# Patient Record
Sex: Male | Born: 1975 | Race: White | Hispanic: No | Marital: Married | Smoking: Current every day smoker
Health system: Southern US, Community
[De-identification: ages and names within clinical notes are randomized; demographics above are authoritative.]

## PROBLEM LIST (undated history)

## (undated) DIAGNOSIS — I10 Essential (primary) hypertension: Principal | ICD-10-CM

## (undated) DIAGNOSIS — F17210 Nicotine dependence, cigarettes, uncomplicated: Secondary | ICD-10-CM

## (undated) DIAGNOSIS — F988 Other specified behavioral and emotional disorders with onset usually occurring in childhood and adolescence: Secondary | ICD-10-CM

## (undated) HISTORY — PX: KNEE SURGERY: SHX244

## (undated) HISTORY — DX: Nicotine dependence, cigarettes, uncomplicated: F17.210

## (undated) HISTORY — PX: ANKLE SURGERY: SHX546

## (undated) HISTORY — PX: NECK SURGERY: SHX720

## (undated) HISTORY — DX: Other specified behavioral and emotional disorders with onset usually occurring in childhood and adolescence: F98.8

## (undated) HISTORY — DX: Essential (primary) hypertension: I10

## (undated) HISTORY — PX: BACK SURGERY: SHX140

## (undated) HISTORY — PX: EYE SURGERY: SHX253

---

## 2015-08-23 DIAGNOSIS — E291 Testicular hypofunction: Secondary | ICD-10-CM | POA: Insufficient documentation

## 2015-08-23 DIAGNOSIS — F339 Major depressive disorder, recurrent, unspecified: Secondary | ICD-10-CM | POA: Insufficient documentation

## 2015-08-23 DIAGNOSIS — E782 Mixed hyperlipidemia: Secondary | ICD-10-CM | POA: Insufficient documentation

## 2016-02-16 DIAGNOSIS — F411 Generalized anxiety disorder: Secondary | ICD-10-CM | POA: Insufficient documentation

## 2016-07-28 DIAGNOSIS — G4733 Obstructive sleep apnea (adult) (pediatric): Secondary | ICD-10-CM | POA: Insufficient documentation

## 2016-08-11 ENCOUNTER — Institutional Professional Consult (permissible substitution): Payer: Self-pay | Admitting: Pulmonary Disease

## 2017-03-15 ENCOUNTER — Ambulatory Visit: Payer: Worker's Compensation | Admitting: Physician Assistant

## 2017-04-19 DIAGNOSIS — A630 Anogenital (venereal) warts: Secondary | ICD-10-CM | POA: Insufficient documentation

## 2017-04-19 LAB — HEMOGLOBIN A1C: Hemoglobin A1C: 5.6

## 2017-09-24 DIAGNOSIS — J0140 Acute pansinusitis, unspecified: Secondary | ICD-10-CM | POA: Diagnosis not present

## 2017-09-24 DIAGNOSIS — J209 Acute bronchitis, unspecified: Secondary | ICD-10-CM | POA: Diagnosis not present

## 2017-10-12 DIAGNOSIS — E782 Mixed hyperlipidemia: Secondary | ICD-10-CM | POA: Diagnosis not present

## 2017-10-12 DIAGNOSIS — R7301 Impaired fasting glucose: Secondary | ICD-10-CM | POA: Diagnosis not present

## 2017-10-12 DIAGNOSIS — F411 Generalized anxiety disorder: Secondary | ICD-10-CM | POA: Diagnosis not present

## 2017-10-12 DIAGNOSIS — F3342 Major depressive disorder, recurrent, in full remission: Secondary | ICD-10-CM | POA: Diagnosis not present

## 2017-10-12 LAB — BASIC METABOLIC PANEL
BUN: 16 (ref 4–21)
Creatinine: 1.3 (ref 0.6–1.3)
Glucose: 109
POTASSIUM: 5 (ref 3.4–5.3)
Sodium: 140 (ref 137–147)

## 2017-10-12 LAB — LIPID PANEL
Cholesterol: 205 — AB (ref 0–200)
HDL: 34 — AB (ref 35–70)
LDL Cholesterol: 127
Triglycerides: 194 — AB (ref 40–160)

## 2017-12-10 DIAGNOSIS — G629 Polyneuropathy, unspecified: Secondary | ICD-10-CM | POA: Diagnosis not present

## 2017-12-10 DIAGNOSIS — F411 Generalized anxiety disorder: Secondary | ICD-10-CM | POA: Diagnosis not present

## 2017-12-10 DIAGNOSIS — F329 Major depressive disorder, single episode, unspecified: Secondary | ICD-10-CM | POA: Diagnosis not present

## 2018-02-12 DIAGNOSIS — M25521 Pain in right elbow: Secondary | ICD-10-CM | POA: Diagnosis not present

## 2018-02-12 DIAGNOSIS — F411 Generalized anxiety disorder: Secondary | ICD-10-CM | POA: Diagnosis not present

## 2018-02-12 DIAGNOSIS — F3342 Major depressive disorder, recurrent, in full remission: Secondary | ICD-10-CM | POA: Diagnosis not present

## 2018-06-20 ENCOUNTER — Ambulatory Visit (HOSPITAL_COMMUNITY)
Admission: EM | Admit: 2018-06-20 | Discharge: 2018-06-20 | Disposition: A | Discharge status: Home / Self Care | Payer: BLUE CROSS/BLUE SHIELD | Attending: Family Medicine | Admitting: Family Medicine

## 2018-06-20 ENCOUNTER — Encounter (HOSPITAL_COMMUNITY): Payer: Self-pay

## 2018-06-20 DIAGNOSIS — I10 Essential (primary) hypertension: Secondary | ICD-10-CM | POA: Diagnosis not present

## 2018-06-20 DIAGNOSIS — E785 Hyperlipidemia, unspecified: Secondary | ICD-10-CM | POA: Insufficient documentation

## 2018-06-20 DIAGNOSIS — M79642 Pain in left hand: Secondary | ICD-10-CM | POA: Insufficient documentation

## 2018-06-20 DIAGNOSIS — M79641 Pain in right hand: Secondary | ICD-10-CM | POA: Insufficient documentation

## 2018-06-20 MED ORDER — AMLODIPINE BESYLATE 5 MG PO TABS: 5.0000 mg | ORAL_TABLET | Freq: Every day | ORAL | 1 refills | Status: DC

## 2018-06-20 NOTE — Discharge Instructions (Addendum)
Amlodipine is a medicine that should help both the blood pressure and the cold hands It may need to be adjusted to a higher dose.   I believe the headaches will improve with BP treatment You need to see a PCP for better heart disease prevention- try the [facility] at [place] or [facility] I would go back on the wellbutrin.  It helps people quit smoking Once the BP comes down, you can re try the meloxicam Return if worse instead of better

## 2018-06-20 NOTE — ED Provider Notes (Signed)
[facility]    CSN: [id number] Arrival date & time: 06/20/18  1734     History   Chief Complaint Chief Complaint  Patient presents with  . Hypertension    HPI Victor Meza is a 43 y.o. male.   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  After he laid down for a few minutes the blood pressure did come back to normal and he was able to get a DOT card for 2 years.  This was several months ago.  Lately he has been having headaches.  His wife checked his blood pressure and found it to be elevated.  It is consistently staying in the 150-170/90 to 100 range over weeks.  He has intermittent headaches.  Intermittent pain in his left fingers.  He does acknowledge the left finger pain is a different issue as it is been doing this chronically, and he describes a white discoloration of his fingers as they get cold.  He also has rotator cuff problems with pain in his left shoulder.  He also has pain in his right elbow.  Because they read side effects and were concerned, he stopped taking his meloxicam although as this was helping his elbow.  He also went off of his Wellbutrin because he thought this might be causing his blood pressure to be elevated.  There is blood pressure in his family. Other issues include, untreated OSA.  States he does not tolerate CPAP He also has a dyslipidemia with an HDL of 30.  He is not on any cholesterol medication He is a smoker He has a family history of cardiovascular disease We discussed that he has multiple cardiovascular risk factors.  I did Framingham risk study to see what his ten-year risk of developing heart disease is at it is over 19%.  I told him it is important that he see his PCP for better cardiac risk factor management and prevention of CVD  History reviewed. No pertinent past medical history.  There are no active problems to display for this patient.   Past Surgical History:  Procedure Laterality  Date  . ANKLE SURGERY    . BACK SURGERY    . EYE SURGERY    . KNEE SURGERY         Home Medications    Prior to Admission medications   Medication Sig Start Date End Date Taking? Authorizing Provider  DULoxetine (CYMBALTA) 60 MG capsule Take 60 mg by mouth daily.   Yes [provider]  amLODipine (NORVASC) 5 MG tablet Take 1 tablet (5 mg total) by mouth daily. 06/20/18   Eustace MooreNelson, Jolynn Bajorek Sue, MD    Family History Family History  Problem Relation Age of Onset  . Diabetes Mother   . Stroke Father   . Thyroid disease Father   . Cancer Father   . Diabetes Other   . Hypertension Other     Social History Social History   Tobacco Use  . Smoking status: Heavy Tobacco Smoker    Packs/day: 1.00    Types: Cigarettes  . Smokeless tobacco: Never Used  Substance Use Topics  . Alcohol use: Not on file  . Drug use: Not on file     Allergies   Other; Percocet [oxycodone-acetaminophen]; and Shellfish allergy   Review of Systems Review of Systems  Constitutional: Negative for chills and fever.  HENT: Negative for ear pain and sore throat.   Eyes: Negative for pain and  visual disturbance.  Respiratory: Negative for cough and shortness of breath.   Cardiovascular: Positive for chest pain. Negative for palpitations.  Gastrointestinal: Negative for abdominal pain and vomiting.  Genitourinary: Negative for dysuria and hematuria.  Musculoskeletal: Positive for arthralgias. Negative for back pain.  Skin: Positive for color change. Negative for rash.  Neurological: Positive for numbness and headaches. Negative for seizures and syncope.  All other systems reviewed and are negative.    Physical Exam Triage Vital Signs ED Triage Vitals  Enc Vitals Group     BP 06/20/18 1818 (!) 148/97     Pulse Rate 06/20/18 1818 97     Resp 06/20/18 1818 20     Temp 06/20/18 1818 98.4 F (36.9 C)     Temp Source 06/20/18 1818 Oral     SpO2 06/20/18 1818 99 %     Weight --       Height --      Head Circumference --      Peak Flow --      Pain Score 06/20/18 1823 6     Pain Loc --      Pain Edu? --      Excl. in GC? --    No data found.  Updated Vital Signs BP (!) 148/97 (BP Location: Right Arm)   Pulse 97   Temp 98.4 F (36.9 C) (Oral)   Resp 20   SpO2 99%       Physical Exam Constitutional:      General: He is not in acute distress.    Appearance: He is well-developed.  HENT:     Head: Normocephalic and atraumatic.     Right Ear: Tympanic membrane and ear canal normal.     Left Ear: Tympanic membrane and ear canal normal.     Nose: Nose normal.     Mouth/Throat:     Mouth: Mucous membranes are moist.  Eyes:     Conjunctiva/sclera: Conjunctivae normal.     Pupils: Pupils are equal, round, and reactive to light.  Neck:     Musculoskeletal: Normal range of motion and neck supple.     Vascular: No carotid bruit.  Cardiovascular:     Rate and Rhythm: Normal rate and regular rhythm.     Heart sounds: Normal heart sounds.  Pulmonary:     Effort: Pulmonary effort is normal. No respiratory distress.     Breath sounds: Normal breath sounds.  Abdominal:     General: There is no distension.     Palpations: Abdomen is soft. There is no mass.     Comments: No HSM  Musculoskeletal: Normal range of motion.  Skin:    General: Skin is warm and dry.  Neurological:     General: No focal deficit present.     Mental Status: He is alert. Mental status is at baseline.  Psychiatric:        Mood and Affect: Mood normal.        Thought Content: Thought content normal.      UC Treatments / Results  Labs (all labs ordered are listed, but only abnormal results are displayed) Labs Reviewed - No data to display  EKG None  Radiology No results found.  Procedures Procedures (including critical care time)  Medications Ordered in UC Medications - No data to display  Initial Impression / Assessment and Plan / UC Course  I have reviewed the triage  vital signs and the nursing notes.  Pertinent labs & imaging results  that were available during my care of the patient were reviewed by me and considered in my medical decision making (see chart for details).     Because the patient has hypertension of  multiple readings and high cardiac risk factors I would not start her on blood pressure medication.  You must follow-up with the PCP for ongoing management.  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 Strongly advised him to quit smoking Final Clinical Impressions(s) / UC Diagnoses   Final diagnoses:  Hypertension, unspecified type  Dyslipidemia (high LDL; low HDL)  Pain in both hands     Discharge Instructions     Amlodipine is a medicine that should help both the blood pressure and the cold hands It may need to be adjusted to a higher dose.   I believe the headaches will improve with BP treatment You need to see a PCP for better heart disease prevention- try the [facility] at [place] or [facility] I would go back on the wellbutrin.  It helps people quit smoking Once the BP comes down, you can re try the meloxicam Return if worse instead of better     ED Prescriptions    Medication Sig Dispense Auth. Provider   amLODipine (NORVASC) 5 MG tablet Take 1 tablet (5 mg total) by mouth daily. 30 tablet Eustace Moore, MD     Controlled Substance Prescriptions [place] Controlled Substance Registry consulted? Not Applicable   Eustace Moore, MD 06/20/18 2209

## 2018-06-20 NOTE — ED Triage Notes (Signed)
Pt presents with elevated blood pressure for the past few weeks, headache, mild chest ache,  and hand numbness.

## 2018-07-03 ENCOUNTER — Encounter: Payer: Self-pay | Admitting: *Deleted

## 2018-07-03 ENCOUNTER — Other Ambulatory Visit: Payer: Self-pay | Admitting: *Deleted

## 2018-07-16 ENCOUNTER — Ambulatory Visit: Payer: BLUE CROSS/BLUE SHIELD | Admitting: Family Medicine

## 2018-07-19 ENCOUNTER — Encounter: Payer: Self-pay | Admitting: Family Medicine

## 2018-07-19 ENCOUNTER — Ambulatory Visit (INDEPENDENT_AMBULATORY_CARE_PROVIDER_SITE_OTHER): Payer: BLUE CROSS/BLUE SHIELD | Admitting: Family Medicine

## 2018-07-19 ENCOUNTER — Other Ambulatory Visit: Payer: Self-pay

## 2018-07-19 VITALS — BP 146/82 | HR 80 | Temp 98.9°F | Resp 16 | Ht 72.0 in | Wt 216.6 lb

## 2018-07-19 DIAGNOSIS — G4733 Obstructive sleep apnea (adult) (pediatric): Secondary | ICD-10-CM

## 2018-07-19 DIAGNOSIS — E782 Mixed hyperlipidemia: Secondary | ICD-10-CM | POA: Diagnosis not present

## 2018-07-19 DIAGNOSIS — I10 Essential (primary) hypertension: Secondary | ICD-10-CM | POA: Diagnosis not present

## 2018-07-19 DIAGNOSIS — F17210 Nicotine dependence, cigarettes, uncomplicated: Secondary | ICD-10-CM

## 2018-07-19 DIAGNOSIS — I7300 Raynaud's syndrome without gangrene: Secondary | ICD-10-CM

## 2018-07-19 DIAGNOSIS — F988 Other specified behavioral and emotional disorders with onset usually occurring in childhood and adolescence: Secondary | ICD-10-CM

## 2018-07-19 DIAGNOSIS — F339 Major depressive disorder, recurrent, unspecified: Secondary | ICD-10-CM

## 2018-07-19 DIAGNOSIS — Z8249 Family history of ischemic heart disease and other diseases of the circulatory system: Secondary | ICD-10-CM | POA: Insufficient documentation

## 2018-07-19 HISTORY — DX: Nicotine dependence, cigarettes, uncomplicated: F17.210

## 2018-07-19 HISTORY — DX: Essential (primary) hypertension: I10

## 2018-07-19 HISTORY — DX: Other specified behavioral and emotional disorders with onset usually occurring in childhood and adolescence: F98.8

## 2018-07-19 LAB — LIPID PANEL
Cholesterol: 255 mg/dL — ABNORMAL HIGH (ref 0–200)
HDL: 39.9 mg/dL (ref >39.00)
LDL Cholesterol: 188 mg/dL — ABNORMAL HIGH (ref 0–99)
NonHDL: 214.95
Total CHOL/HDL Ratio: 6
Triglycerides: 134 mg/dL (ref 0.0–149.0)
VLDL: 26.8 mg/dL (ref 0.0–40.0)

## 2018-07-19 LAB — CBC WITH DIFFERENTIAL/PLATELET
Basophils Absolute: 0.1 10*3/uL (ref 0.0–0.1)
Basophils Relative: 1.3 % (ref 0.0–3.0)
EOS ABS: 0.3 10*3/uL (ref 0.0–0.7)
Eosinophils Relative: 4.1 % (ref 0.0–5.0)
HEMATOCRIT: 41.6 % (ref 39.0–52.0)
Hemoglobin: 14.4 g/dL (ref 13.0–17.0)
Lymphocytes Relative: 24.3 % (ref 12.0–46.0)
Lymphs Abs: 1.5 10*3/uL (ref 0.7–4.0)
MCHC: 34.7 g/dL (ref 30.0–36.0)
MCV: 89 fl (ref 78.0–100.0)
Monocytes Absolute: 0.4 10*3/uL (ref 0.1–1.0)
Monocytes Relative: 7 % (ref 3.0–12.0)
NEUTROS ABS: 4 10*3/uL (ref 1.4–7.7)
Neutrophils Relative %: 63.3 % (ref 43.0–77.0)
PLATELETS: 286 10*3/uL (ref 150.0–400.0)
RBC: 4.67 Mil/uL (ref 4.22–5.81)
RDW: 12.6 % (ref 11.5–15.5)
WBC: 6.3 10*3/uL (ref 4.0–10.5)

## 2018-07-19 LAB — COMPREHENSIVE METABOLIC PANEL
ALT: 24 U/L (ref 0–53)
AST: 17 U/L (ref 0–37)
Albumin: 4.3 g/dL (ref 3.5–5.2)
Alkaline Phosphatase: 50 U/L (ref 39–117)
BUN: 11 mg/dL (ref 6–23)
CO2: 28 mEq/L (ref 19–32)
Calcium: 9.3 mg/dL (ref 8.4–10.5)
Chloride: 103 mEq/L (ref 96–112)
Creatinine, Ser: 1.16 mg/dL (ref 0.40–1.50)
GFR: 68.39 mL/min (ref >60.00)
Glucose, Bld: 89 mg/dL (ref 70–99)
Potassium: 4.2 mEq/L (ref 3.5–5.1)
Sodium: 137 mEq/L (ref 135–145)
Total Bilirubin: 0.4 mg/dL (ref 0.2–1.2)
Total Protein: 7 g/dL (ref 6.0–8.3)

## 2018-07-19 LAB — TSH: TSH: 2.71 u[IU]/mL (ref 0.35–4.50)

## 2018-07-19 MED ORDER — AMLODIPINE BESYLATE 10 MG PO TABS: 10.0000 mg | ORAL_TABLET | Freq: Every day | ORAL | 3 refills | Status: DC

## 2018-07-19 NOTE — Patient Instructions (Addendum)
Please return in 6 weeks for follow up of your hypertension. I've increased the dose of the amlodipine today. This should help bring down your blood pressure readings.   Start thinking about what would be the reason/sign you would know it was time to quit smoking.... this is your number one health risk factor.   We will call you with information regarding your referral appointment. Pulmonology for sleep apnea follow up.  If you do not hear from [place] within the next 2 weeks, please let me know. It can take 1-2 weeks to get appointments set up with the specialists.    It was a pleasure meeting you today! Thank you for choosing [place] to meet your healthcare needs! I truly look forward to working with you. If you have any questions or concerns, please send me a message via Mychart or call the office at [phone].  Your Heart disease and stroke risk factors include: high blood pressure, high cholesterol, smoking, sleep apnea and family history or early heart disease.    Hypertension Hypertension, commonly called high blood pressure, is when the force of blood pumping through the arteries is too strong. The arteries are the blood vessels that carry blood from the heart throughout the body. Hypertension forces the heart to work harder to pump blood and may cause arteries to become narrow or stiff. Having untreated or uncontrolled hypertension can cause heart attacks, strokes, kidney disease, and other problems. A blood pressure reading consists of a higher number over a lower number. Ideally, your blood pressure should be below 120/80. The first ("top") number is called the systolic pressure. It is a measure of the pressure in your arteries as your heart beats. The second ("bottom") number is called the diastolic pressure. It is a measure of the pressure in your arteries as the heart relaxes. What are the causes? The cause of this condition is not known. What increases the risk? Some risk factors for high  blood pressure are under your control. Others are not. Factors you can change  Smoking.  Having type 2 diabetes mellitus, high cholesterol, or both.  Not getting enough exercise or physical activity.  Being overweight.  Having too much fat, sugar, calories, or salt (sodium) in your diet.  Drinking too much alcohol. Factors that are difficult or impossible to change  Having chronic kidney disease.  Having a family history of high blood pressure.  Age. Risk increases with age.  Race. You may be at higher risk if you are African-American.  Gender. Men are at higher risk than women before age 60. After age 4, women are at higher risk than men.  Having obstructive sleep apnea.  Stress. What are the signs or symptoms? Extremely high blood pressure (hypertensive crisis) may cause:  Headache.  Anxiety.  Shortness of breath.  Nosebleed.  Nausea and vomiting.  Severe chest pain.  Jerky movements you cannot control (seizures). How is this diagnosed? This condition is diagnosed by measuring your blood pressure while you are seated, with your arm resting on a surface. The cuff of the blood pressure monitor will be placed directly against the skin of your upper arm at the level of your heart. It should be measured at least twice using the same arm. Certain conditions can cause a difference in blood pressure between your right and left arms. Certain factors can cause blood pressure readings to be lower or higher than normal (elevated) for a short period of time:  When your blood pressure is higher  when you are in a health care provider's office than when you are at home, this is called white coat hypertension. Most people with this condition do not need medicines.  When your blood pressure is higher at home than when you are in a health care provider's office, this is called masked hypertension. Most people with this condition may need medicines to control blood pressure. If you  have a high blood pressure reading during one visit or you have normal blood pressure with other risk factors:  You may be asked to return on a different day to have your blood pressure checked again.  You may be asked to monitor your blood pressure at home for 1 week or longer. If you are diagnosed with hypertension, you may have other blood or imaging tests to help your health care provider understand your overall risk for other conditions. How is this treated? This condition is treated by making healthy lifestyle changes, such as eating healthy foods, exercising more, and reducing your alcohol intake. Your health care provider may prescribe medicine if lifestyle changes are not enough to get your blood pressure under control, and if:  Your systolic blood pressure is above 130.  Your diastolic blood pressure is above 80. Your personal target blood pressure may vary depending on your medical conditions, your age, and other factors. Follow these instructions at home: Eating and drinking   Eat a diet that is high in fiber and potassium, and low in sodium, added sugar, and fat. An example eating plan is called the DASH (Dietary Approaches to Stop Hypertension) diet. To eat this way: ? Eat plenty of fresh fruits and vegetables. Try to fill half of your plate at each meal with fruits and vegetables. ? Eat whole grains, such as whole wheat pasta, brown rice, or whole grain bread. Fill about one quarter of your plate with whole grains. ? Eat or drink low-fat dairy products, such as skim milk or low-fat yogurt. ? Avoid fatty cuts of meat, processed or cured meats, and poultry with skin. Fill about one quarter of your plate with lean proteins, such as fish, chicken without skin, beans, eggs, and tofu. ? Avoid premade and processed foods. These tend to be higher in sodium, added sugar, and fat.  Reduce your daily sodium intake. Most people with hypertension should eat less than 1,500 mg of sodium a  day.  Limit alcohol intake to no more than 1 drink a day for nonpregnant women and 2 drinks a day for men. One drink equals 12 oz of beer, 5 oz of wine, or 1 oz of hard liquor. Lifestyle   Work with your health care provider to maintain a healthy body weight or to lose weight. Ask what an ideal weight is for you.  Get at least 30 minutes of exercise that causes your heart to beat faster (aerobic exercise) most days of the week. Activities may include walking, swimming, or biking.  Include exercise to strengthen your muscles (resistance exercise), such as pilates or lifting weights, as part of your weekly exercise routine. Try to do these types of exercises for 30 minutes at least 3 days a week.  Do not use any products that contain nicotine or tobacco, such as cigarettes and e-cigarettes. If you need help quitting, ask your health care provider.  Monitor your blood pressure at home as told by your health care provider.  Keep all follow-up visits as told by your health care provider. This is important. Medicines  Take  over-the-counter and prescription medicines only as told by your health care provider. Follow directions carefully. Blood pressure medicines must be taken as prescribed.  Do not skip doses of blood pressure medicine. Doing this puts you at risk for problems and can make the medicine less effective.  Ask your health care provider about side effects or reactions to medicines that you should watch for. Contact a health care provider if:  You think you are having a reaction to a medicine you are taking.  You have headaches that keep coming back (recurring).  You feel dizzy.  You have swelling in your ankles.  You have trouble with your vision. Get help right away if:  You develop a severe headache or confusion.  You have unusual weakness or numbness.  You feel faint.  You have severe pain in your chest or abdomen.  You vomit repeatedly.  You have trouble  breathing. Summary  Hypertension is when the force of blood pumping through your arteries is too strong. If this condition is not controlled, it may put you at risk for serious complications.  Your personal target blood pressure may vary depending on your medical conditions, your age, and other factors. For most people, a normal blood pressure is less than 120/80.  Hypertension is treated with lifestyle changes, medicines, or a combination of both. Lifestyle changes include weight loss, eating a healthy, low-sodium diet, exercising more, and limiting alcohol. This information is not intended to replace advice given to you by your health care provider. Make sure you discuss any questions you have with your health care provider. Document Released: 06/05/2005 Document Revised: 05/03/2016 Document Reviewed: 05/03/2016 Elsevier Interactive Patient Education  2019 [employer].

## 2018-07-19 NOTE — Progress Notes (Signed)
Subjective  CC:  Chief Complaint  Patient presents with  . Establish Care    Last CPE 06/2017  . Hypertension    Taking amlodipine and wants to discuss other options  . Depression    Taking Bupropion daily    HPI: Victor Meza is a 43 y.o. male who presents to [place] at [facility] today to establish care with me as a new patient.  I've personally reviewed recent office visit notes, hospital notes, associated labs and imaging reports and/or pertinent outside office records via chart review or CareEverywhere. Briefly, recent UC visit for elevated blood pressure readings: new DX HTN started on amlodipine. Reviewed past medical records from PCP and pulm. Updated PL in detail as documented below.  He has the following concerns or needs:  HTN: on amlodipine 5 with decrease in diastolics. Tolerating well. Has multiple CV risk factors: HLD, smoking, + FH CAD, and sleep apnea no longer on cpap or bipap. He is motivated to get these things under better control. Compliance with amlodipine has been good. 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 Diet is standard [place].   Tobacco dependence: 30 pak year history. precontemplative about quitting. Has never successfully quit in the past although he has tried. Denies known COPD sxs or dx.   OSA: needs new pulmonologist. Reports "last doctor didn't listen". Records show he was on cpap and then bipap which he didn't tolerate.   Mood disorder: treated for depression with anxiety with cymbalta 60 and wellbutrin. Reports meds help. Endorses easy irritability. Mood is ok now. Also with h/o ADD as child; not treated with meds as an adult but manages behaviorally.   raynauds syndrome in fingers: at time it is mildly painful.   HM: nonfasting today. Last labs 4/ 2019, reviewed.    BP Readings from Last 3 Encounters:  07/19/18 (!) 146/82  06/20/18 (!) 148/97   Wt Readings from Last 3 Encounters:  07/19/18 216 lb  9.6 oz (98.2 kg)    Lab Results  Component Value Date   CHOL 205 (A) 10/12/2017   Lab Results  Component Value Date   HDL 34 (A) 10/12/2017   Lab Results  Component Value Date   LDLCALC 127 10/12/2017   Lab Results  Component Value Date   TRIG 194 (A) 10/12/2017   No results found for: CHOLHDL No results found for: LDLDIRECT Lab Results  Component Value Date   CREATININE 1.3 10/12/2017   BUN 16 10/12/2017   NA 140 10/12/2017   K 5.0 10/12/2017    The 10-year ASCVD risk score Mikey Bussing DC Jr., et al., 2013) is: 12.4%*   Values used to calculate the score:     Age: 44 years     Sex: Male     Is Non-Hispanic African American: No     Diabetic: No     Tobacco smoker: Yes     Systolic Blood Pressure: 845 mmHg     Is BP treated: Yes     HDL Cholesterol: 34 mg/dL*     Total Cholesterol: 205 mg/dL*     * - Cholesterol units were assumed for this score calculation  Assessment  1. Essential hypertension   2. Nicotine dependence, cigarettes, uncomplicated   3. Mixed dyslipidemia   4. OSA (obstructive sleep apnea)   5. Major depression, recurrent, chronic (HCC)   6. Raynaud's disease without gangrene   7. 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 Increase amlodipine to '10mg'$  daily and recheck in 6 weeks. Add 2nd medication at that time if needed. Check renal function and electrolytes.  HLD: recheck today. Likely a statin will be indicated. Discussed healthy diet.   Tobacco: precontemplative. Counseling done.   Raynauds: amlodipine  OSA - refer to pulm for re-evaluation.   Mood: stable by report.   Educated about CV risk factor reduction.   Follow up:  Return in about 6 weeks (around 08/30/2018) for follow up Hypertension. Orders Placed This Encounter  Procedures  . CBC w/Diff  . Comp Met (CMET)  . Lipid Profile  . TSH  . Ambulatory referral to Pulmonology    Meds ordered this encounter  Medications  . amLODipine (NORVASC) 10 MG tablet    Sig: Take 1 tablet (10 mg total) by mouth daily.    Dispense:  90 tablet    Refill:  3     Depression screen PHQ 2/9 07/19/2018  Decreased Interest 0  Down, Depressed, Hopeless 1  PHQ - 2 Score 1  Altered sleeping 0  Tired, decreased energy 0  Change in appetite 0  Feeling bad or failure about yourself  1  Trouble concentrating 0  Moving slowly or fidgety/restless 0  Suicidal thoughts 0  PHQ-9 Score 2  Difficult doing work/chores Not difficult at all    We updated and reviewed the patient's past history in detail and it is documented below.  Patient Active Problem List   Diagnosis Date Noted  . Essential hypertension 07/19/2018    Priority: High  . Nicotine dependence, cigarettes, uncomplicated 11/91/4782    Priority: High  . Family history of premature CAD 07/19/2018    Priority: High    Maternal GF in 8s Dad had heart attack 90s   . OSA (obstructive sleep apnea) 07/28/2016    Priority: High  . Generalized anxiety disorder 02/16/2016    Priority: High  . Major depression, recurrent, chronic ([place]) 08/23/2015    Priority: High  . Mixed dyslipidemia 08/23/2015    Priority: High  . Raynaud's disease without gangrene 07/19/2018    Priority: Medium  . ADD (attention deficit disorder) 07/19/2018    Priority: Medium    Diagnosed as child: never treated as an adult   . Genital warts 04/19/2017    Priority: Low  . Hypogonadism in male 08/23/2015    Priority: [place] Maintenance  Topic Date Due  . HIV Screening  07/08/1989  . INFLUENZA VACCINE  09/17/2018 (Originally 01/17/2018)  . TETANUS/TDAP  06/19/2021   Immunization History  Administered Date(s) Administered  . Tdap 06/20/2011   Current Meds  Medication Sig  . amLODipine (NORVASC) 10 MG tablet Take 1 tablet (10 mg total) by mouth daily.  Marland Kitchen buPROPion (WELLBUTRIN XL) 300 MG 24 hr tablet Take by mouth.  . DULoxetine  (CYMBALTA) 60 MG capsule Take 60 mg by mouth daily.  . meloxicam (MOBIC) 15 MG tablet TAKE 1 TABLET BY MOUTH EVERY DAY  . [DISCONTINUED] amLODipine (NORVASC) 5 MG tablet Take 1 tablet (5 mg total) by mouth daily.    Allergies: Patient is allergic to morphine; other; percocet [oxycodone-acetaminophen]; and shellfish allergy. Past Medical History Patient  has a past medical history of ADD (attention deficit disorder) (07/19/2018), Essential hypertension (07/19/2018), and Nicotine dependence, cigarettes, uncomplicated (9/56/2130). Past Surgical History Patient  has a past surgical history that includes Eye surgery (Left); Ankle surgery;  Knee surgery (Left); Back surgery; and Neck surgery (Left). Family History: Patient family history includes Alcohol abuse in his maternal grandfather; Arthritis in his father, mother, paternal grandfather, and paternal grandmother; Asthma in his maternal grandfather; Cancer in his father and paternal grandmother; Depression in his maternal grandfather and mother; Diabetes in his maternal grandmother, mother, and paternal grandmother; Hearing loss in his maternal grandmother; Heart disease in his father, maternal grandfather, maternal grandmother, and paternal grandmother; Hyperlipidemia in his father, maternal grandmother, and mother; Hypertension in his father, maternal grandmother, mother, and paternal grandmother; Hypothyroidism in his mother; Learning disabilities in his daughter; Liver disease in his paternal grandfather; Stroke in his father; Thyroid disease in his father. Social History:  Patient  reports that he has been smoking cigarettes. He has a 30.00 pack-year smoking history. He has never used smokeless tobacco. He reports current alcohol use. He reports that he does not use drugs.  Review of Systems: Constitutional: negative for fever or malaise Ophthalmic: negative for photophobia, double vision or loss of vision Cardiovascular: negative for chest pain,  dyspnea on exertion, or new LE swelling Respiratory: negative for SOB or persistent cough Gastrointestinal: negative for abdominal pain, change in bowel habits or melena Genitourinary: negative for dysuria or gross hematuria Musculoskeletal: negative for new gait disturbance or muscular weakness Integumentary: negative for new or persistent rashes Neurological: negative for TIA or stroke symptoms Psychiatric: negative for SI or delusions Allergic/Immunologic: negative for hives  Patient Care Team    Relationship Specialty Notifications Start End  Leamon Arnt, MD PCP - General Family Medicine  07/19/18     Objective  Vitals: BP (!) 146/82   Pulse 80   Temp 98.9 F (37.2 C) (Oral)   Resp 16   Ht 6' (1.829 m)   Wt 216 lb 9.6 oz (98.2 kg)   SpO2 95%   BMI 29.38 kg/m  General:  Well developed, well nourished, no acute distress  Psych:  Alert and oriented,normal mood and affect HEENT:  Normocephalic, atraumatic, non-icteric sclera, PERRL, oropharynx is without mass or exudate, supple neck without adenopathy, mass or thyromegaly Cardiovascular:  RRR without gallop, rub or murmur, nondisplaced PMI Respiratory:  Good breath sounds bilaterally, CTAB with normal respiratory effort MSK: no deformities, contusions. Joints are without erythema or swelling Skin:  Warm, no rashes or suspicious lesions noted Neurologic:    Mental status is normal. No tremor. Normal gait   Commons side effects, risks, benefits, and alternatives for medications and treatment plan prescribed today were discussed, and the patient expressed understanding of the given instructions. Patient is instructed to call or message via MyChart if he/she has any questions or concerns regarding our treatment plan. No barriers to understanding were identified. We discussed Red Flag symptoms and signs in detail. Patient expressed understanding regarding what to do in case of urgent or emergency type symptoms.   Medication list  was reconciled, printed and provided to the patient in AVS. Patient instructions and summary information was reviewed with the patient as documented in the AVS. This note was prepared with assistance of Dragon voice recognition software. Occasional wrong-word or sound-a-like substitutions may have occurred due to the inherent limitations of voice recognition software

## 2018-07-22 MED ORDER — ATORVASTATIN CALCIUM 10 MG PO TABS: 10.0000 mg | ORAL_TABLET | Freq: Every day | ORAL | 3 refills | Status: AC

## 2018-07-22 NOTE — Addendum Note (Signed)
Addended by: Asencion Partridge on: 07/22/2018 08:51 AM   Modules accepted: Orders

## 2018-07-23 ENCOUNTER — Encounter: Payer: Self-pay | Admitting: Family Medicine

## 2018-07-24 ENCOUNTER — Encounter: Payer: Self-pay | Admitting: *Deleted

## 2018-08-26 ENCOUNTER — Other Ambulatory Visit: Payer: Self-pay

## 2018-08-26 ENCOUNTER — Encounter: Payer: Self-pay | Admitting: Family Medicine

## 2018-08-26 ENCOUNTER — Ambulatory Visit (INDEPENDENT_AMBULATORY_CARE_PROVIDER_SITE_OTHER): Payer: BLUE CROSS/BLUE SHIELD | Admitting: Family Medicine

## 2018-08-26 VITALS — BP 122/86 | HR 77 | Temp 98.2°F | Resp 16 | Ht 72.0 in | Wt 212.4 lb

## 2018-08-26 DIAGNOSIS — F17210 Nicotine dependence, cigarettes, uncomplicated: Secondary | ICD-10-CM

## 2018-08-26 DIAGNOSIS — M7712 Lateral epicondylitis, left elbow: Secondary | ICD-10-CM | POA: Diagnosis not present

## 2018-08-26 DIAGNOSIS — I10 Essential (primary) hypertension: Secondary | ICD-10-CM

## 2018-08-26 DIAGNOSIS — E782 Mixed hyperlipidemia: Secondary | ICD-10-CM | POA: Diagnosis not present

## 2018-08-26 MED ORDER — LISINOPRIL 10 MG PO TABS: 10.0000 mg | ORAL_TABLET | Freq: Every day | ORAL | 3 refills | Status: DC

## 2018-08-26 NOTE — Patient Instructions (Addendum)
Please return in 10-12 weeks for blood pressure and cholesterol follow up; please come fasting. Also, OV for shoulder and elbow evaluation.   If you have any questions or concerns, please don't hesitate to send me a message via MyChart or call the office at [phone]. Thank you for visiting with [place] today! It's our pleasure caring for you.   Tennis Elbow  Tennis elbow (lateral epicondylitis) is inflammation of tendons in your outer forearm, near your elbow. Tendons are tissues that connect muscle to bone. When you have tennis elbow, inflammation affects the tendons that you use to bend your wrist and move your hand up. Inflammation occurs in the lower part of the upper arm bone (humerus), where the tendons connect to the bone (lateral epicondyle). Tennis elbow often affects people who play tennis, but anyone may get the condition from repeatedly extending the wrist or turning the forearm. What are the causes? This condition is usually caused by repeatedly extending the wrist, turning the forearm, and using the hands. It can result from sports or work that requires repetitive forearm movements. In some cases, it may be caused by a sudden injury. What increases the risk? You are more likely to develop tennis elbow if you play tennis or another racket sport. You also have a higher risk if you frequently use your hands for work. Besides people who play tennis, others at greater risk include:  Musicians.  Carpenters, painters, and plumbers.  Cooks.  Cashiers.  People who work in [employer].  Holiday representative workers.  Butchers.  People who use computers. What are the signs or symptoms? Symptoms of this condition include:  Pain and tenderness in the forearm and the outer part of the elbow. Pain may be felt only when using the arm, or it may be there all the time.  A burning feeling that starts in the elbow and spreads down the forearm.  A weak grip in the hand. How is this  diagnosed? This condition may be diagnosed based on:  Your symptoms and medical history.  A physical exam.  X-rays.  MRI. How is this treated? Resting and icing your arm is often the first treatment. Your health care provider may also recommend:  Medicines to reduce pain and inflammation. These may be in the form of a pill, topical gels, or shots of a steroid medicine (cortisone).  An elbow strap to reduce stress on the area.  Physical therapy. This may include massage or exercises.  An elbow brace to restrict the movements that cause symptoms. If these treatments do not help relieve your symptoms, your health care provider may recommend surgery to remove damaged muscle and reattach healthy muscle to bone. Follow these instructions at home: Activity  Rest your elbow and wrist and avoid activities that cause symptoms, as told by your health care provider.  Do physical therapy exercises as instructed.  If you lift an object, lift it with your palm facing up. This reduces stress on your elbow. Lifestyle  If your tennis elbow is caused by sports, check your equipment and make sure that: ? You are using it correctly. ? It is the best fit for you.  If your tennis elbow is caused by work or computer use, take frequent breaks to stretch your arm. Talk with your manager about ways to manage your condition at work. If you have a brace:  Wear the brace or strap as told by your health care provider. Remove it only as told by your health care provider.  Loosen the brace if your fingers tingle, become numb, or turn cold and blue.  Keep the brace clean.  If the brace is not waterproof, ask if you may remove it for bathing. If you must keep the brace on while bathing: ? Do not let it get wet. ? Cover it with a watertight covering when you take a bath or a shower. General instructions   If directed, put ice on the painful area: ? Put ice in a plastic bag. ? Place a towel between  your skin and the bag. ? Leave the ice on for 20 minutes, 2-3 times a day.  Take over-the-counter and prescription medicines only as told by your health care provider.  Keep all follow-up visits as told by your health care provider. This is important. Contact a health care provider if:  You have pain that gets worse or does not get better with treatment.  You have numbness or weakness in your forearm, hand, or fingers. Summary  Tennis elbow (lateral epicondylitis) is inflammation of tendons in your outer forearm, near your elbow.  Common symptoms include pain and tenderness in your forearm and the outer part of your elbow.  This condition is usually caused by repeatedly extending your wrist, turning your forearm, and using your hands.  The first treatment is often resting and icing your arm to relieve symptoms. Further treatment may include taking medicine, getting physical therapy, wearing a brace or strap, or having surgery. This information is not intended to replace advice given to you by your health care provider. Make sure you discuss any questions you have with your health care provider. Document Released: 06/05/2005 Document Revised: 03/20/2017 Document Reviewed: 03/20/2017 Elsevier Interactive Patient Education  2019 [employer].

## 2018-08-26 NOTE — Progress Notes (Signed)
Subjective  CC:  Chief Complaint  Patient presents with  . Hypertension    He states his home BP runs about 140/90s    HPI: Victor Meza is a 43 y.o. male who presents to the office today to address the problems listed above in the chief complaint.  Hypertension f/u: Control is fair . Pt reports he is doing well. taking medications as instructed, no medication side effects noted, no TIAs, no chest pain on exertion, no dyspnea on exertion, no swelling of ankles.  Home readings however remain consistently mildly elevated averaging 140s over 90s.  Denies adverse effects from his BP medications. Compliance with medication is good.  No adverse effects  Hyperlipidemia, started atorvastatin 10 mg last month.  Has missed about 25% of the time due to nighttime dosing.  Would prefer to take it in the morning.  No adverse effects.  Complains of left shoulder and left elbow pain.  On meloxicam for left elbow pain.  Manual labor.  Pain is localized and worse with certain movements.  Has history of bilateral rotator cuff problems as well.  Smoking dependence: Remains pre-contemplative about quitting  Assessment  1. Essential hypertension   2. Mixed dyslipidemia   3. Nicotine dependence, cigarettes, uncomplicated   4. Lateral epicondylitis of left elbow      Plan    Hypertension f/u: BP control is improved but not yet controlled.  Add lisinopril 10 mg to amlodipine.  Recheck 2 to 3 months.  Recheck renal function at that time.  Hyperlipidemia f/u: Continue statin.  Recheck lipids in 3 months.  Lateral epicondylitis: Education given.  Start icing, rest and continue meloxicam.  Return for possible steroid injection if not improving.  Left shoulder pain: Return for evaluation. Education regarding management of these chronic disease states was given. Management strategies discussed on successive visits include dietary and exercise recommendations, goals of achieving and maintaining IBW, and  lifestyle modifications aiming for adequate sleep and minimizing stressors.   Follow up: Return in about 3 months (around 11/26/2018) for follow up hypercholesterolemia, follow up Hypertension.  No orders of the defined types were placed in this encounter.  Meds ordered this encounter  Medications  . lisinopril (PRINIVIL,ZESTRIL) 10 MG tablet    Sig: Take 1 tablet (10 mg total) by mouth daily.    Dispense:  90 tablet    Refill:  3      BP Readings from Last 3 Encounters:  08/26/18 122/86  07/19/18 (!) 146/82  06/20/18 (!) 148/97   Wt Readings from Last 3 Encounters:  08/26/18 212 lb 6.4 oz (96.3 kg)  07/19/18 216 lb 9.6 oz (98.2 kg)    Lab Results  Component Value Date   CHOL 255 (H) 07/19/2018   CHOL 205 (A) 10/12/2017   Lab Results  Component Value Date   HDL 39.90 07/19/2018   HDL 34 (A) 10/12/2017   Lab Results  Component Value Date   LDLCALC 188 (H) 07/19/2018   LDLCALC 127 10/12/2017   Lab Results  Component Value Date   TRIG 134.0 07/19/2018   TRIG 194 (A) 10/12/2017   Lab Results  Component Value Date   CHOLHDL 6 07/19/2018   No results found for: LDLDIRECT Lab Results  Component Value Date   CREATININE 1.16 07/19/2018   BUN 11 07/19/2018   NA 137 07/19/2018   K 4.2 07/19/2018   CL 103 07/19/2018   CO2 28 07/19/2018    The 10-year ASCVD risk score Denman George DC Montez Hageman., et  al., 2013) is: 10.9%   Values used to calculate the score:     Age: 44 years     Sex: Male     Is Non-Hispanic African American: No     Diabetic: No     Tobacco smoker: Yes     Systolic Blood Pressure: 122 mmHg     Is BP treated: Yes     HDL Cholesterol: 39.9 mg/dL     Total Cholesterol: 255 mg/dL  I reviewed the patients updated PMH, FH, and SocHx.    Patient Active Problem List   Diagnosis Date Noted  . Essential hypertension 07/19/2018    Priority: High  . Nicotine dependence, cigarettes, uncomplicated 07/19/2018    Priority: High  . Family history of premature CAD  07/19/2018    Priority: High  . OSA (obstructive sleep apnea) 07/28/2016    Priority: High  . Generalized anxiety disorder 02/16/2016    Priority: High  . Major depression, recurrent, chronic (HCC) 08/23/2015    Priority: High  . Mixed dyslipidemia 08/23/2015    Priority: High  . Raynaud's disease without gangrene 07/19/2018    Priority: Medium  . ADD (attention deficit disorder) 07/19/2018    Priority: Medium  . Genital warts 04/19/2017    Priority: Low  . Hypogonadism in male 08/23/2015    Priority: Low    Allergies: Morphine; Other; Percocet [oxycodone-acetaminophen]; and Shellfish allergy  Social History: Patient  reports that he has been smoking cigarettes. He has a 30.00 pack-year smoking history. He has never used smokeless tobacco. He reports current alcohol use. He reports that he does not use drugs.  Current Meds  Medication Sig  . amLODipine (NORVASC) 10 MG tablet Take 1 tablet (10 mg total) by mouth daily.  Marland Kitchen atorvastatin (LIPITOR) 10 MG tablet Take 1 tablet (10 mg total) by mouth at bedtime.  Marland Kitchen buPROPion (WELLBUTRIN XL) 300 MG 24 hr tablet Take by mouth.  . DULoxetine (CYMBALTA) 60 MG capsule Take 60 mg by mouth daily.  . meloxicam (MOBIC) 15 MG tablet TAKE 1 TABLET BY MOUTH EVERY DAY    Review of Systems: Cardiovascular: negative for chest pain, palpitations, leg swelling, orthopnea Respiratory: negative for SOB, wheezing or persistent cough Gastrointestinal: negative for abdominal pain Genitourinary: negative for dysuria or gross hematuria  Objective  Vitals: BP 122/86   Pulse 77   Temp 98.2 F (36.8 C) (Oral)   Resp 16   Ht 6' (1.829 m)   Wt 212 lb 6.4 oz (96.3 kg)   SpO2 98%   BMI 28.81 kg/m  General: no acute distress  Psych:  Alert and oriented, normal mood and affect HEENT:  Normocephalic, atraumatic, supple neck  Cardiovascular:  RRR without murmur. no edema Respiratory:  Good breath sounds bilaterally, CTAB with normal respiratory  effort Skin:  Warm, no rashes Left elbow: Tender over lateral epicondyles with full range of motion and no effusion, pain with resisted supination Neurologic:   Mental status is normal  Commons side effects, risks, benefits, and alternatives for medications and treatment plan prescribed today were discussed, and the patient expressed understanding of the given instructions. Patient is instructed to call or message via MyChart if he/she has any questions or concerns regarding our treatment plan. No barriers to understanding were identified. We discussed Red Flag symptoms and signs in detail. Patient expressed understanding regarding what to do in case of urgent or emergency type symptoms.   Medication list was reconciled, printed and provided to the patient in AVS. Patient  instructions and summary information was reviewed with the patient as documented in the AVS. This note was prepared with assistance of Dragon voice recognition software. Occasional wrong-word or sound-a-like substitutions may have occurred due to the inherent limitations of voice recognition software

## 2018-08-30 ENCOUNTER — Ambulatory Visit (INDEPENDENT_AMBULATORY_CARE_PROVIDER_SITE_OTHER): Payer: BLUE CROSS/BLUE SHIELD | Admitting: Family Medicine

## 2018-08-30 ENCOUNTER — Encounter: Payer: Self-pay | Admitting: Family Medicine

## 2018-08-30 ENCOUNTER — Other Ambulatory Visit: Payer: Self-pay

## 2018-08-30 VITALS — BP 124/76 | HR 74 | Temp 98.1°F | Resp 16 | Wt 212.2 lb

## 2018-08-30 DIAGNOSIS — G8929 Other chronic pain: Secondary | ICD-10-CM

## 2018-08-30 DIAGNOSIS — M25512 Pain in left shoulder: Secondary | ICD-10-CM | POA: Diagnosis not present

## 2018-08-30 DIAGNOSIS — M7712 Lateral epicondylitis, left elbow: Secondary | ICD-10-CM

## 2018-08-30 MED ORDER — TRIAMCINOLONE ACETONIDE 40 MG/ML IJ SUSP
20.0000 mg | Freq: Once | INTRAMUSCULAR | Status: AC
  Administered 2018-08-30: 20 mg via INTRAMUSCULAR

## 2018-08-30 NOTE — Patient Instructions (Addendum)
Please follow up as scheduled for your next visit with me: 11/04/2018   Please go to our [facility] office to get your xrays done. You can walk in M-F between 8am and 5pm. Tell them you are there for xrays ordered by me. They will send me the results, then I will let you know the results with instructions.   Address: [address] [place], [place], [place] [phone]  (office sits at [address] at [address] intersection; from here, turn left onto [place] [address]), take to [address], turn right and go for a mile or so, office will be on left across form [address] )  We will call you with information regarding your referral appointment. Sports Medicine specialist.  If you do not hear from [place] within the next 2 weeks, please let me know. It can take 1-2 weeks to get appointments set up with the specialists.   If you have any questions or concerns, please don't hesitate to send me a message via MyChart or call the office at [phone]. Thank you for visiting with [place] today! It's our pleasure caring for you.  You had a steroid injection today.   Things to be aware of after this injection are listed below:  You may experience no significant improvement or even a slight worsening in your symptoms during the first 24 to 48 hours.  After that we expect your symptoms to improve gradually over the next 2 weeks for the medicine to have its maximal effect.  You should continue to have improvement out to 6 weeks after your injection.  I recommend icing the site of the injection for 20 minutes  1-2 times the day of your injection  You may shower but no swimming, tub bath or Jacuzzi for 24 hours.  If your bandage falls off this does not need to be replaced.  It is appropriate to remove the bandage after 4 hours.  You may resume light activities as tolerated.     POSSIBLE PROCEDURE SIDE EFFECTS: The side effects of the injection are usually fairly minimal  however if you may experience some of the following side effects that are usually self-limited and will is off on their own.  If you are concerned please feel free to call the office with questions:             Increased numbness or tingling             Nausea or vomiting             Swelling or bruising at the injection site    Please call our office if if you experience any of the following symptoms over the next week as these can be signs of infection:              Fever greater than 100.77F             Significant swelling at the injection site             Significant redness or drainage from the injection site

## 2018-08-30 NOTE — Progress Notes (Signed)
Subjective  CC:  Chief Complaint  Patient presents with  . Shoulder Pain    Left side.. Thinks it is due to torn rotator cuff  . Elbow Pain    Bilateral.. left is is worse.Marland Kitchen    HPI: Victor Meza is a 43 y.o. male who presents to the office today to address the problems listed above in the chief complaint.  Victor Meza presents for follow-up of left lateral epicondylitis: Has tried icing and anti-inflammatories without significant relief.  Has pain with certain movements.  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  No carpal tunnel symptoms.  No weakness  Reports 45-month history of left shoulder pain.  He believes he injured it at work but cannot remember the details.  Since he has pain within the shoulder area with certain movements.  It is not limiting.  Is there most days of the week, mild, untreated.  Has never had it evaluated before.  No neck pain or radicular symptoms.  No weakness.  Assessment  1. Lateral epicondylitis of left elbow   2. Chronic left shoulder pain      Plan   Lateral epicondylitis, left: Steroid injection given today.  Routine postprocedure care instructions given.  Preventive splint, rest, consider wrist splint.  Icing.  See after visit summary.  Chronic left shoulder pain: Possible chronic rotator cuff tendinopathy.  X-ray and refer to sports medicine given duration of symptoms.  Follow up: Return for as scheduled.  11/04/2018  Orders Placed This Encounter  Procedures  . DG Shoulder Left  . Ambulatory referral to Sports Medicine   Meds ordered this encounter  Medications  . triamcinolone acetonide (KENALOG-40) injection 20 mg      I reviewed the patients updated PMH, FH, and SocHx.    Patient Active Problem List   Diagnosis Date Noted  . Essential hypertension 07/19/2018    Priority: High  . Nicotine dependence, cigarettes, uncomplicated 07/19/2018    Priority: High  . Family history of premature CAD 07/19/2018    Priority: High  . OSA (obstructive  sleep apnea) 07/28/2016    Priority: High  . Generalized anxiety disorder 02/16/2016    Priority: High  . Major depression, recurrent, chronic (HCC) 08/23/2015    Priority: High  . Mixed dyslipidemia 08/23/2015    Priority: High  . Raynaud's disease without gangrene 07/19/2018    Priority: Medium  . ADD (attention deficit disorder) 07/19/2018    Priority: Medium  . Genital warts 04/19/2017    Priority: Low  . Hypogonadism in male 08/23/2015    Priority: Low   Current Meds  Medication Sig  . amLODipine (NORVASC) 10 MG tablet Take 1 tablet (10 mg total) by mouth daily.  Marland Kitchen atorvastatin (LIPITOR) 10 MG tablet Take 1 tablet (10 mg total) by mouth at bedtime.  Marland Kitchen buPROPion (WELLBUTRIN XL) 300 MG 24 hr tablet Take by mouth.  . DULoxetine (CYMBALTA) 60 MG capsule Take 60 mg by mouth daily.  Marland Kitchen lisinopril (PRINIVIL,ZESTRIL) 10 MG tablet Take 1 tablet (10 mg total) by mouth daily.  . meloxicam (MOBIC) 15 MG tablet TAKE 1 TABLET BY MOUTH EVERY DAY    Allergies: Patient is allergic to morphine; other; percocet [oxycodone-acetaminophen]; and shellfish allergy. Family History: Patient family history includes Alcohol abuse in his maternal grandfather; Arthritis in his father, mother, paternal grandfather, and paternal grandmother; Asthma in his maternal grandfather; Cancer in his father and paternal grandmother; Depression in his maternal grandfather and mother; Diabetes in his maternal grandmother, mother, and paternal  grandmother; Hearing loss in his maternal grandmother; Heart disease in his father, maternal grandfather, maternal grandmother, and paternal grandmother; Hyperlipidemia in his father, maternal grandmother, and mother; Hypertension in his father, maternal grandmother, mother, and paternal grandmother; Hypothyroidism in his mother; Learning disabilities in his daughter; Liver disease in his paternal grandfather; Stroke in his father; Thyroid disease in his father. Social History:   Patient  reports that he has been smoking cigarettes. He has a 30.00 pack-year smoking history. He has never used smokeless tobacco. He reports current alcohol use. He reports that he does not use drugs.  Review of Systems: Constitutional: Negative for fever malaise or anorexia Cardiovascular: negative for chest pain Respiratory: negative for SOB or persistent cough Gastrointestinal: negative for abdominal pain  Objective  Vitals: BP 124/76   Pulse 74   Temp 98.1 F (36.7 C) (Oral)   Resp 16   Wt 212 lb 3.2 oz (96.3 kg)   SpO2 94%   BMI 28.78 kg/m  General: no acute distress , A&Ox3 Physical Exam Musculoskeletal:     Left shoulder: He exhibits pain. He exhibits normal range of motion, no tenderness, no bony tenderness, no swelling, no effusion, normal pulse and normal strength.     Left elbow: He exhibits normal range of motion, no swelling, no effusion, no deformity and no laceration. Tenderness (over lateral epicondyle) found. No radial head, no medial epicondyle and no olecranon process tenderness noted.    Skin:  Warm, no rashes  Lateral epicondylitis Steroid Injection Procedure Note  Pre-operative Diagnosis: left lateral epicondylitis  Post-operative Diagnosis: same  Indications: pain and treatment   Anesthesia:cold spray  Procedure Details   Verbal consent was obtained for the procedure. Universal time out done. The point of maximum tenderness was identified and marked. The skin prepped with alcohol and cold spray was used for anesthesia.  A 1" 25ga needle was advanced into the skin over the lateral epicondyle and proximal ligament and  0.25 ml of triamcinolone (KENALOG) 40mg /ml with 0.68ml of lidocaine 1% was injected.   Complications:  None; patient tolerated the procedure well.     Commons side effects, risks, benefits, and alternatives for medications and treatment plan prescribed today were discussed, and the patient expressed understanding of the given  instructions. Patient is instructed to call or message via MyChart if he/she has any questions or concerns regarding our treatment plan. No barriers to understanding were identified. We discussed Red Flag symptoms and signs in detail. Patient expressed understanding regarding what to do in case of urgent or emergency type symptoms.   Medication list was reconciled, printed and provided to the patient in AVS. Patient instructions and summary information was reviewed with the patient as documented in the AVS. This note was prepared with assistance of Dragon voice recognition software. Occasional wrong-word or sound-a-like substitutions may have occurred due to the inherent limitations of voice recognition software

## 2018-09-05 ENCOUNTER — Ambulatory Visit (INDEPENDENT_AMBULATORY_CARE_PROVIDER_SITE_OTHER): Payer: BLUE CROSS/BLUE SHIELD | Admitting: Sports Medicine

## 2018-09-05 ENCOUNTER — Ambulatory Visit (INDEPENDENT_AMBULATORY_CARE_PROVIDER_SITE_OTHER): Payer: BLUE CROSS/BLUE SHIELD | Admitting: Physician Assistant

## 2018-09-05 ENCOUNTER — Encounter: Payer: Self-pay | Admitting: Sports Medicine

## 2018-09-05 ENCOUNTER — Encounter: Payer: Self-pay | Admitting: Physician Assistant

## 2018-09-05 ENCOUNTER — Other Ambulatory Visit: Payer: Self-pay

## 2018-09-05 VITALS — BP 118/80 | HR 76 | Ht 72.0 in | Wt 211.6 lb

## 2018-09-05 VITALS — BP 118/80 | HR 82 | Temp 98.2°F | Resp 16 | Ht 72.0 in | Wt 212.0 lb

## 2018-09-05 DIAGNOSIS — G2589 Other specified extrapyramidal and movement disorders: Secondary | ICD-10-CM

## 2018-09-05 DIAGNOSIS — M25512 Pain in left shoulder: Secondary | ICD-10-CM

## 2018-09-05 DIAGNOSIS — G5632 Lesion of radial nerve, left upper limb: Secondary | ICD-10-CM

## 2018-09-05 DIAGNOSIS — M67922 Unspecified disorder of synovium and tendon, left upper arm: Secondary | ICD-10-CM | POA: Diagnosis not present

## 2018-09-05 DIAGNOSIS — M7712 Lateral epicondylitis, left elbow: Secondary | ICD-10-CM

## 2018-09-05 DIAGNOSIS — G8929 Other chronic pain: Secondary | ICD-10-CM

## 2018-09-05 DIAGNOSIS — M67912 Unspecified disorder of synovium and tendon, left shoulder: Secondary | ICD-10-CM | POA: Diagnosis not present

## 2018-09-05 MED ORDER — GABAPENTIN 300 MG PO CAPS: ORAL_CAPSULE | ORAL | 1 refills | Status: DC

## 2018-09-05 MED ORDER — METHYLPREDNISOLONE 4 MG PO TBPK: ORAL_TABLET | ORAL | 0 refills | Status: DC

## 2018-09-05 NOTE — Progress Notes (Signed)
Patient presents to clinic today c/o continued left lateral elbow pain with radiation into forearm. Was seen 1 week ago by PCP and diagnosed with lateral epicondylitis and left rotator cuff tendinopathy. At that time was started on a counterforce brace, Meloxicam and RICE. X-ray left shoulder ordered at that time. HE has not had this yet. Patient notes the brace is helping his elbow pain but is sill present. Notes shoulder pain with ROM but denies decreased strength or ROM. Denies swelling, numbness or tingling. Denies any new or worsening symptoms.  Past Medical History:  Diagnosis Date  . ADD (attention deficit disorder) 07/19/2018   Diagnosed as child: never treated as an adult  . Essential hypertension 07/19/2018  . Nicotine dependence, cigarettes, uncomplicated 4/66/5993    Current Outpatient Medications on File Prior to Visit  Medication Sig Dispense Refill  . amLODipine (NORVASC) 10 MG tablet Take 1 tablet (10 mg total) by mouth daily. 90 tablet 3  . atorvastatin (LIPITOR) 10 MG tablet Take 1 tablet (10 mg total) by mouth at bedtime. 90 tablet 3  . buPROPion (WELLBUTRIN XL) 300 MG 24 hr tablet Take by mouth.    . DULoxetine (CYMBALTA) 60 MG capsule Take 60 mg by mouth daily.    Marland Kitchen lisinopril (PRINIVIL,ZESTRIL) 10 MG tablet Take 1 tablet (10 mg total) by mouth daily. 90 tablet 3  . meloxicam (MOBIC) 15 MG tablet TAKE 1 TABLET BY MOUTH EVERY DAY     No current facility-administered medications on file prior to visit.     Allergies  Allergen Reactions  . Morphine Itching  . Other     eggplant  . Percocet [Oxycodone-Acetaminophen]   . Shellfish Allergy     Family History  Problem Relation Age of Onset  . Diabetes Mother   . Arthritis Mother   . Depression Mother   . Hyperlipidemia Mother   . Hypertension Mother   . Hypothyroidism Mother   . Stroke Father   . Thyroid disease Father   . Cancer Father   . Heart disease Father   . Arthritis Father   . Hyperlipidemia Father    . Hypertension Father   . Diabetes Maternal Grandmother   . Hearing loss Maternal Grandmother   . Heart disease Maternal Grandmother   . Hyperlipidemia Maternal Grandmother   . Hypertension Maternal Grandmother   . Heart disease Maternal Grandfather   . Alcohol abuse Maternal Grandfather   . Asthma Maternal Grandfather   . Depression Maternal Grandfather   . Learning disabilities Daughter   . Arthritis Paternal Grandmother   . Cancer Paternal Grandmother   . Diabetes Paternal Grandmother   . Heart disease Paternal Grandmother   . Hypertension Paternal Grandmother   . Arthritis Paternal Grandfather   . Liver disease Paternal Grandfather     Social History   Socioeconomic History  . Marital status: Married    Spouse name: Not on file  . Number of children: Not on file  . Years of education: Not on file  . Highest education level: Not on file  Occupational History  . Occupation: Environmental consultant  . Financial resource strain: Not on file  . Food insecurity:    Worry: Not on file    Inability: Not on file  . Transportation needs:    Medical: Not on file    Non-medical: Not on file  Tobacco Use  . Smoking status: Current Every Day Smoker    Packs/day: 1.00    Years: 30.00  Pack years: 30.00    Types: Cigarettes  . Smokeless tobacco: Never Used  Substance and Sexual Activity  . Alcohol use: Yes  . Drug use: Never  . Sexual activity: Yes  Lifestyle  . Physical activity:    Days per week: Not on file    Minutes per session: Not on file  . Stress: Not on file  Relationships  . Social connections:    Talks on phone: Not on file    Gets together: Not on file    Attends religious service: Not on file    Active member of club or organization: Not on file    Attends meetings of clubs or organizations: Not on file    Relationship status: Not on file  Other Topics Concern  . Not on file  Social History Narrative  . Not on file   Review of Systems -  See HPI.  All other ROS are negative.  BP 118/80   Pulse 82   Temp 98.2 F (36.8 C) (Oral)   Resp 16   Ht 6' (1.829 m)   Wt 212 lb (96.2 kg)   SpO2 98%   BMI 28.75 kg/m   Physical Exam Vitals signs reviewed.  Constitutional:      Appearance: Normal appearance.  HENT:     Head: Normocephalic and atraumatic.     Right Ear: Tympanic membrane normal.     Left Ear: Tympanic membrane normal.     Nose: Nose normal.     Mouth/Throat:     Mouth: Mucous membranes are moist.  Eyes:     Pupils: Pupils are equal, round, and reactive to light.  Neck:     Musculoskeletal: Neck supple.  Cardiovascular:     Rate and Rhythm: Normal rate and regular rhythm.     Pulses: Normal pulses.     Heart sounds: Normal heart sounds.  Pulmonary:     Effort: Pulmonary effort is normal.     Breath sounds: Normal breath sounds.  Abdominal:     Palpations: Abdomen is soft.  Musculoskeletal:     Left shoulder: He exhibits pain (with external and internal rotation. + empty can test. No decreased strength or decreased ROM noted). He exhibits normal range of motion.     Left elbow: Normal.     Cervical back: Normal.  Neurological:     Mental Status: He is alert.     Recent Results (from the past 2160 hour(s))  CBC w/Diff     Status: None   Collection Time: 07/19/18 11:33 AM  Result Value Ref Range   WBC 6.3 4.0 - 10.5 K/uL   RBC 4.67 4.22 - 5.81 Mil/uL   Hemoglobin 14.4 13.0 - 17.0 g/dL   HCT 41.6 39.0 - 52.0 %   MCV 89.0 78.0 - 100.0 fl   MCHC 34.7 30.0 - 36.0 g/dL   RDW 12.6 11.5 - 15.5 %   Platelets 286.0 150.0 - 400.0 K/uL   Neutrophils Relative % 63.3 43.0 - 77.0 %   Lymphocytes Relative 24.3 12.0 - 46.0 %   Monocytes Relative 7.0 3.0 - 12.0 %   Eosinophils Relative 4.1 0.0 - 5.0 %   Basophils Relative 1.3 0.0 - 3.0 %   Neutro Abs 4.0 1.4 - 7.7 K/uL   Lymphs Abs 1.5 0.7 - 4.0 K/uL   Monocytes Absolute 0.4 0.1 - 1.0 K/uL   Eosinophils Absolute 0.3 0.0 - 0.7 K/uL   Basophils Absolute  0.1 0.0 - 0.1 K/uL  Comp  Met (CMET)     Status: None   Collection Time: 07/19/18 11:33 AM  Result Value Ref Range   Sodium 137 135 - 145 mEq/L   Potassium 4.2 3.5 - 5.1 mEq/L   Chloride 103 96 - 112 mEq/L   CO2 28 19 - 32 mEq/L   Glucose, Bld 89 70 - 99 mg/dL   BUN 11 6 - 23 mg/dL   Creatinine, Ser 1.16 0.40 - 1.50 mg/dL   Total Bilirubin 0.4 0.2 - 1.2 mg/dL   Alkaline Phosphatase 50 39 - 117 U/L   AST 17 0 - 37 U/L   ALT 24 0 - 53 U/L   Total Protein 7.0 6.0 - 8.3 g/dL   Albumin 4.3 3.5 - 5.2 g/dL   Calcium 9.3 8.4 - 10.5 mg/dL   GFR 68.39 >60.00 mL/min  Lipid Profile     Status: Abnormal   Collection Time: 07/19/18 11:33 AM  Result Value Ref Range   Cholesterol 255 (H) 0 - 200 mg/dL    Comment: ATP III Classification       Desirable:  < 200 mg/dL               Borderline High:  200 - 239 mg/dL          High:  > = 240 mg/dL   Triglycerides 134.0 0.0 - 149.0 mg/dL    Comment: Normal:  <150 mg/dLBorderline High:  150 - 199 mg/dL   HDL 39.90 >39.00 mg/dL   VLDL 26.8 0.0 - 40.0 mg/dL   LDL Cholesterol 188 (H) 0 - 99 mg/dL   Total CHOL/HDL Ratio 6     Comment:                Men          Women1/2 Average Risk     3.4          3.3Average Risk          5.0          4.42X Average Risk          9.6          7.13X Average Risk          15.0          11.0                       NonHDL 214.95     Comment: NOTE:  Non-HDL goal should be 30 mg/dL higher than patient's LDL goal (i.e. LDL goal of < 70 mg/dL, would have non-HDL goal of < 100 mg/dL)  TSH     Status: None   Collection Time: 07/19/18 11:33 AM  Result Value Ref Range   TSH 2.71 0.35 - 4.50 uIU/mL   Assessment/Plan: 1. Lateral epicondylitis of left elbow Continue counterforce brace. Will stop Meloxicam and start Medrol pack. Tylenol OTC. Referral to sports medicine placed.  2. Tendinopathy of left rotator cuff No decreased strength. Steroid as directed. Scheduled appt with sports medicine today for further assessment/management.    Leeanne Rio, PA-C

## 2018-09-05 NOTE — Patient Instructions (Addendum)
Please perform the exercise program that we have prepared for you and gone over in detail on a daily basis.  In addition to the handout you were provided you can access your program through: [email or web address]   Your unique program code is:  [email or web address]

## 2018-09-05 NOTE — Progress Notes (Signed)
Victor Meza. [name] at [facility] [phone]  Victor Meza - 43 y.o. male MRN [id number]  Date of birth: Jan 22, 1976  Visit Date: September 05, 2018  PCP: Willow Ora, MD   Referred by: Willow Ora, MD  SUBJECTIVE:  Chief Complaint  Patient presents with  . New Patient (Initial Visit)    L shoulder, arm and elbow pain.  Kenalog 40 injection - 08/30/18.  Prednisone taper.  Takes Meloxicam and tried gabapentin before.    HPI: Patient presents for evaluation of his left shoulder arm and elbow pain.  He has had issues with this for several years but the left elbow has been worsening over the past several weeks.  He was seen by his PCP who performed a tennis elbow injection and he actually reported worsening symptoms after the initial 2 to 3 days.  During that initial timeframe he was actually feeling better but that has slowly returned to actually worse than where he was previously.  Pain is now radiating halfway down his forearm described as a tightness and burning.  He is previously been on gabapentin for this as well but did not have good benefit with this and it was discontinued.  This was prior to changing his PCP.  His left shoulder has been painful with overhead range of motion for quite some time as well and it does seem to be exacerbated with overhead reaching.  Pain is anterior nature.  He denies any significant neck pain denies any pain connecting the left shoulder and elbow.  He has some grip strength weakness due to both of these issues.  He has tried prednisone taper that he is currently just starting he is on meloxicam 15 mg a day is only been mildly helpful.  He has been using a counterforce strap fairly excessively and this may actually seem to be exacerbating it when talking about it.  REVIEW OF SYSTEMS: Night time disturbances: Reports, Difficulty with sleep onset Fevers, chills and night sweats: Denies Unexplained weight  loss: Denies Personal history of cancer: Denies Changes in bowel or bladder habits: Denies Recent unreported falls: Denies New or worsening dyspnea or wheezing: Denies Headaches and dizziness: Denies Numbness, tingling and weakness in the extremities: Reports, Per HPI Dizziness or presyncopal episodes: Denies Lower extremity edema: Denies  HISTORY:  Prior history reviewed and updated per electronic medical record.  Patient Active Problem List   Diagnosis Date Noted  . Essential hypertension 07/19/2018  . Nicotine dependence, cigarettes, uncomplicated 07/19/2018  . Raynaud's disease without gangrene 07/19/2018  . Family history of premature CAD 07/19/2018    Maternal GF in 39s Dad had heart attack 60s   . ADD (attention deficit disorder) 07/19/2018    Diagnosed as child: never treated as an adult   . Genital warts 04/19/2017  . OSA (obstructive sleep apnea) 07/28/2016  . Generalized anxiety disorder 02/16/2016  . Major depression, recurrent, chronic (HCC) 08/23/2015  . Mixed dyslipidemia 08/23/2015  . Hypogonadism in male 08/23/2015   Social History   Occupational History  . Occupation: Games developer  Tobacco Use  . Smoking status: Current Every Day Smoker    Packs/day: 1.00    Years: 30.00    Pack years: 30.00    Types: Cigarettes  . Smokeless tobacco: Never Used  Substance and Sexual Activity  . Alcohol use: Yes  . Drug use: Never  . Sexual activity: Yes   Social History   Social History Narrative  .  Not on file   Past Medical History:  Diagnosis Date  . ADD (attention deficit disorder) 07/19/2018   Diagnosed as child: never treated as an adult  . Essential hypertension 07/19/2018  . Nicotine dependence, cigarettes, uncomplicated 07/19/2018   Past Surgical History:  Procedure Laterality Date  . ANKLE SURGERY    . BACK SURGERY     L3, L4, L5, S1  . EYE SURGERY Left   . KNEE SURGERY Left   . NECK SURGERY Left    C2   family history includes Alcohol  abuse in his maternal grandfather; Arthritis in his father, mother, paternal grandfather, and paternal grandmother; Asthma in his maternal grandfather; Cancer in his father and paternal grandmother; Depression in his maternal grandfather and mother; Diabetes in his maternal grandmother, mother, and paternal grandmother; Hearing loss in his maternal grandmother; Heart disease in his father, maternal grandfather, maternal grandmother, and paternal grandmother; Hyperlipidemia in his father, maternal grandmother, and mother; Hypertension in his father, maternal grandmother, mother, and paternal grandmother; Hypothyroidism in his mother; Learning disabilities in his daughter; Liver disease in his paternal grandfather; Stroke in his father; Thyroid disease in his father.  OBJECTIVE:  VS:  HT:6' (182.9 cm)   WT:211 lb 9.6 oz (96 kg)  BMI:28.69    BP:118/80  HR:76bpm  TEMP: ( )  RESP:96 %   PHYSICAL EXAM: CONSTITUTIONAL: Well-developed, Well-nourished and In no acute distress EYES: Pupils are equal., EOM intact without nystagmus. and No scleral icterus. Psychiatric: Alert & appropriately interactive. and Not depressed or anxious appearing. EXTREMITY EXAM: Warm and well perfused  Left elbow is focally tender over the lateral condyle.  He has full flexion and extension.  His most focally tender over the common extensor musculature especially at the arcade of Frohse.  There is a slightly positive Tinel's here.  He has good grip strength though that is painful.  Pain with third finger extension.  His elbow flexion strength is intact.  Elbow extension is normal.  No pain with palpation of the triceps.  Left shoulder is well aligned he does have a slight anterior chain dominance.  Scapular dyskinesis with overhead range of motion.  He has full unrestricted range of motion.  Intrinsic rotator cuff strength is intact with internal rotation, external rotation, empty can testing speeds testing and O'Brien's testing  however O'Brien's and speeds testing are moderately painful.  He has no pain with Hawkins or Neer's.  He has some discomfort with axial load and circumduction but no crepitation.  Negative arm squeeze test, negative brachial plexus squeeze, negative Spurling's compression test.  Is good cervical range of motion.  Upper extremity sensation intact light touch.   ASSESSMENT:   1. Radial nerve entrapment, left   2. Lateral epicondylitis of left elbow   3. Chronic left shoulder pain   4. Tendinopathy of left biceps tendon   5. Scapular dyskinesis     PROCEDURES:  PROCEDURE NOTE: THERAPEUTIC EXERCISES (97110) 15 minutes spent for Therapeutic exercises as below and as referenced in the AVS.  This included exercises focusing on stretching, strengthening, with significant focus on eccentric aspects.   Proper technique shown and discussed handout in great detail with ATC.  All questions were discussed and answered.   Long term goals include an improvement in range of motion, strength, endurance as well as avoiding reinjury. Frequency of visits is one time as determined during today's  office visit. Frequency of exercises to be performed is as per handout.  EXERCISES REVIEWED: Intrinsic Rotator Cuff  Exercises Scapular Stabilization tennis elbow stretching     PLAN:  Pertinent additional documentation may be included in corresponding procedure notes, imaging studies, problem based documentation and patient instructions.  No problem-specific Assessment & Plan notes found for this encounter.   Overall this does seem consistent with biceps tendinosis with underlying scapular dyskinesis as well as a separate left elbow pain consistent with lateral epicondylosis with likely superficial radial nerve entrapment at the level of the arcade of frose.  He has already been started on a Medrol Dosepak but I would like to start him back on gabapentin for the elbow component and we discussed that this is  likely multifactorial and which is why the gabapentin previously was not fully beneficial.  We will have him start on this consistently as well as home therapeutic exercises as prescribed today and follow-up in 6 weeks to ensure clinical improvements.  Can consider further diagnostic imaging at that time if any lack of improvements.  This does seem consistent with 2 separate issues and not a true radicular component but this should also be considered follow-up if any persistent pain.    Home Therapeutic Exercises: New program prescribed today per procedure note.   Activity modifications and the importance of avoiding exacerbating activities (limiting pain to no more than a 4 / 10 during or following activity) recommended and discussed.   Discussed red flag symptoms that warrant earlier emergent evaluation and patient voices understanding.    Meds ordered this encounter  Medications  . gabapentin (NEURONTIN) 300 MG capsule    Sig: Start with 1 tab po qhs X 1 week, then increase to 1 tab po bid X 1 week then 1 tab po tid prn    Dispense:  90 capsule    Refill:  1   Lab Orders  No laboratory test(s) ordered today   Imaging Orders  No imaging studies ordered today   Referral Orders  No referral(s) requested today    Return in about 6 weeks (around 10/17/2018).          Andrena Mews, DO    [facility]

## 2018-09-05 NOTE — Patient Instructions (Signed)
Please go to the front desk and speak to Marylene Land who is trying to get you scheduled with Dr. Berline Chough. If not able to see you today, please go for x-ray (see below).  Please go to the [facility] office for x-ray. We will call you with your results and alter treatment accordingly.   [facility] Kellogg [address]  [place], [place] [address]  Please stop Meloxicam. Start the prednisone taper. Tylenol for breakthrough pain.  Continue wearing brace. No heavy lifting or overexertion.  We will get this figured out and work on getting you feeling better.

## 2018-10-12 DIAGNOSIS — M542 Cervicalgia: Secondary | ICD-10-CM | POA: Diagnosis not present

## 2018-10-12 DIAGNOSIS — M79632 Pain in left forearm: Secondary | ICD-10-CM | POA: Diagnosis not present

## 2018-10-12 DIAGNOSIS — R52 Pain, unspecified: Secondary | ICD-10-CM | POA: Diagnosis not present

## 2018-10-12 DIAGNOSIS — S0990XA Unspecified injury of head, initial encounter: Secondary | ICD-10-CM | POA: Diagnosis not present

## 2018-10-12 DIAGNOSIS — M7989 Other specified soft tissue disorders: Secondary | ICD-10-CM | POA: Diagnosis not present

## 2018-10-12 DIAGNOSIS — M549 Dorsalgia, unspecified: Secondary | ICD-10-CM | POA: Diagnosis not present

## 2018-10-12 DIAGNOSIS — M545 Low back pain: Secondary | ICD-10-CM | POA: Diagnosis not present

## 2018-10-12 DIAGNOSIS — M79602 Pain in left arm: Secondary | ICD-10-CM | POA: Diagnosis not present

## 2018-10-12 DIAGNOSIS — S199XXA Unspecified injury of neck, initial encounter: Secondary | ICD-10-CM | POA: Diagnosis not present

## 2018-10-12 DIAGNOSIS — R51 Headache: Secondary | ICD-10-CM | POA: Diagnosis not present

## 2018-10-12 DIAGNOSIS — S39012A Strain of muscle, fascia and tendon of lower back, initial encounter: Secondary | ICD-10-CM | POA: Diagnosis not present

## 2018-10-12 DIAGNOSIS — S161XXA Strain of muscle, fascia and tendon at neck level, initial encounter: Secondary | ICD-10-CM | POA: Diagnosis not present

## 2018-10-12 DIAGNOSIS — S5012XA Contusion of left forearm, initial encounter: Secondary | ICD-10-CM | POA: Diagnosis not present

## 2018-10-12 DIAGNOSIS — S060X1A Concussion with loss of consciousness of 30 minutes or less, initial encounter: Secondary | ICD-10-CM | POA: Diagnosis not present

## 2018-10-14 ENCOUNTER — Encounter: Payer: Self-pay | Admitting: Family Medicine

## 2018-10-14 ENCOUNTER — Ambulatory Visit (INDEPENDENT_AMBULATORY_CARE_PROVIDER_SITE_OTHER): Payer: BLUE CROSS/BLUE SHIELD | Admitting: Family Medicine

## 2018-10-14 ENCOUNTER — Other Ambulatory Visit: Payer: Self-pay

## 2018-10-14 DIAGNOSIS — S0093XD Contusion of unspecified part of head, subsequent encounter: Secondary | ICD-10-CM | POA: Diagnosis not present

## 2018-10-14 DIAGNOSIS — S14109S Unspecified injury at unspecified level of cervical spinal cord, sequela: Secondary | ICD-10-CM | POA: Diagnosis not present

## 2018-10-14 DIAGNOSIS — M79632 Pain in left forearm: Secondary | ICD-10-CM

## 2018-10-14 DIAGNOSIS — M545 Low back pain: Secondary | ICD-10-CM

## 2018-10-14 DIAGNOSIS — R202 Paresthesia of skin: Secondary | ICD-10-CM

## 2018-10-14 DIAGNOSIS — G8911 Acute pain due to trauma: Secondary | ICD-10-CM

## 2018-10-14 NOTE — Progress Notes (Signed)
I have discussed the procedure for the virtual visit with the patient who has given consent to proceed with assessment and treatment.   Tiara S Simmons, CMA     
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
## 2018-10-15 ENCOUNTER — Telehealth: Payer: Self-pay | Admitting: Family Medicine

## 2018-10-15 ENCOUNTER — Encounter: Payer: Self-pay | Admitting: *Deleted

## 2018-10-15 MED ORDER — HYDROCODONE-ACETAMINOPHEN 5-325 MG PO TABS: 1.0000 | ORAL_TABLET | Freq: Four times a day (QID) | ORAL | 0 refills | Status: DC | PRN

## 2018-10-15 NOTE — Telephone Encounter (Signed)
Please call: apologies; I sent in pain medication this am for him. Thanks.

## 2018-10-15 NOTE — Telephone Encounter (Signed)
Please advise 

## 2018-10-15 NOTE — Telephone Encounter (Signed)
Copied from CRM [id number]. Topic: Quick Communication - Rx Refill/Question >> Oct 14, 2018  4:43 PM Maye Hides wrote: Medication: Pt states that he was seen to day for back pain and he was told that prescription would be sent to pharmacy for pain med  Has the patient contacted their pharmacy?yes (Agent: If yes, when and what did the pharmacy advise?)  Preferred Pharmacy (with phone number or street name):   Agent: Please be advised that RX refills may take up to 3 business days. We ask that you follow-up with your pharmacy.

## 2018-10-15 NOTE — Telephone Encounter (Signed)
Pts voicemail box is full. Sending a message on ychart to make him aware medication sent.

## 2018-10-17 ENCOUNTER — Ambulatory Visit: Payer: BLUE CROSS/BLUE SHIELD | Admitting: Sports Medicine

## 2018-10-21 ENCOUNTER — Encounter: Payer: Self-pay | Admitting: Family Medicine

## 2018-10-21 MED ORDER — NYSTATIN 100000 UNIT/ML MT SUSP: OROMUCOSAL | 0 refills | Status: DC

## 2018-10-23 ENCOUNTER — Encounter: Payer: Self-pay | Admitting: Family Medicine

## 2018-10-23 MED ORDER — BUPROPION HCL ER (XL) 300 MG PO TB24: 300.0000 mg | ORAL_TABLET | Freq: Every day | ORAL | 3 refills | Status: DC

## 2018-10-23 NOTE — Telephone Encounter (Signed)
We have not filled this yet, wanted to get the ok to move forward with refilling.

## 2018-10-24 ENCOUNTER — Encounter: Payer: Self-pay | Admitting: Family Medicine

## 2018-10-24 ENCOUNTER — Ambulatory Visit (INDEPENDENT_AMBULATORY_CARE_PROVIDER_SITE_OTHER): Payer: BLUE CROSS/BLUE SHIELD | Admitting: Family Medicine

## 2018-10-24 ENCOUNTER — Other Ambulatory Visit: Payer: Self-pay

## 2018-10-24 VITALS — BP 112/70 | HR 74 | Temp 98.2°F | Resp 16 | Ht 72.0 in | Wt 218.0 lb

## 2018-10-24 DIAGNOSIS — G8911 Acute pain due to trauma: Secondary | ICD-10-CM

## 2018-10-24 DIAGNOSIS — S14109S Unspecified injury at unspecified level of cervical spinal cord, sequela: Secondary | ICD-10-CM | POA: Diagnosis not present

## 2018-10-24 DIAGNOSIS — E782 Mixed hyperlipidemia: Secondary | ICD-10-CM | POA: Diagnosis not present

## 2018-10-24 DIAGNOSIS — R202 Paresthesia of skin: Secondary | ICD-10-CM | POA: Diagnosis not present

## 2018-10-24 DIAGNOSIS — M545 Low back pain: Secondary | ICD-10-CM

## 2018-10-24 DIAGNOSIS — B370 Candidal stomatitis: Secondary | ICD-10-CM

## 2018-10-24 DIAGNOSIS — I10 Essential (primary) hypertension: Secondary | ICD-10-CM | POA: Diagnosis not present

## 2018-10-24 LAB — COMPREHENSIVE METABOLIC PANEL
ALT: 26 U/L (ref 0–53)
AST: 19 U/L (ref 0–37)
Albumin: 4.2 g/dL (ref 3.5–5.2)
Alkaline Phosphatase: 55 U/L (ref 39–117)
BUN: 14 mg/dL (ref 6–23)
CO2: 27 mEq/L (ref 19–32)
Calcium: 9.1 mg/dL (ref 8.4–10.5)
Chloride: 106 mEq/L (ref 96–112)
Creatinine, Ser: 1.05 mg/dL (ref 0.40–1.50)
GFR: 76.63 mL/min (ref >60.00)
Glucose, Bld: 92 mg/dL (ref 70–99)
Potassium: 4.4 mEq/L (ref 3.5–5.1)
Sodium: 140 mEq/L (ref 135–145)
Total Bilirubin: 0.4 mg/dL (ref 0.2–1.2)
Total Protein: 6.9 g/dL (ref 6.0–8.3)

## 2018-10-24 LAB — LIPID PANEL
Cholesterol: 186 mg/dL (ref 0–200)
HDL: 41.9 mg/dL (ref >39.00)
LDL Cholesterol: 110 mg/dL — ABNORMAL HIGH (ref 0–99)
NonHDL: 144.08
Total CHOL/HDL Ratio: 4
Triglycerides: 169 mg/dL — ABNORMAL HIGH (ref 0.0–149.0)
VLDL: 33.8 mg/dL (ref 0.0–40.0)
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
## 2018-10-24 NOTE — Patient Instructions (Signed)
Please return in 6 months for blood pressure follow up.   I will release your lab results to you on your MyChart account with further instructions. Please reply with any questions.   Continue the nystatin for 7 days.   If you have any questions or concerns, please don't hesitate to send me a message via MyChart or call the office at [phone]. Thank you for visiting with us today! It's our pleasure caring for you.   Oral Thrush, Adult  Oral thrush, also called oral candidiasis, is a fungal infection that develops in the mouth and throat and on the tongue. It causes white patches to form on the mouth and tongue. Victor Pitmanhrush is most common in older adults, but it can occur at any age. Many cases of thrush are mild, but this infection can also be serious. Victor Pitmanhrush can be a repeated (recurrent) problem for certain people who have a weak body defense system (immune system). The weakness can be caused by chronic illnesses, or by taking medicines that limit the body's ability to fight infection. If a person has difficulty fighting infection, the fungus that causes thrush can spread through the body. This can cause life-threatening blood or organ infections. What are the causes? This condition is caused by a fungus (yeast) called Candida albicans.  This fungus is normally present in small amounts in the mouth and on other mucous membranes. It usually causes no harm.  If conditions are present that allow the fungus to grow without control, it invades surrounding tissues and becomes an infection.  Other Candida species can also lead to thrush (rare). What increases the risk? This condition is more likely to develop in:  People with a weakened immune system.  Older adults.  People with HIV (human immunodeficiency virus).  People with diabetes.  People with dry mouth (xerostomia).  Pregnant women.  People with poor dental care, especially people who have false teeth.  People who use antibiotic  medicines. What are the signs or symptoms? Symptoms of this condition can vary from mild and moderate to severe and persistent. Symptoms may include:  A burning feeling in the mouth and throat. This can occur at the start of a thrush infection.  White patches that stick to the mouth and tongue. The tissue around the patches may be red, raw, and painful. If rubbed (during tooth brushing, for example), the patches and the tissue of the mouth may bleed easily.  A bad taste in the mouth or difficulty tasting foods.  A cottony feeling in the mouth.  Pain during eating and swallowing.  Poor appetite.  Cracking at the corners of the mouth. How is this diagnosed? This condition is diagnosed based on:  Physical exam. Your health care provider will look in your mouth.  Health history. Your health care provider will ask you questions about your health. How is this treated? This condition is treated with medicines called antifungals, which prevent the growth of fungi. These medicines are either applied directly to the affected area (topical) or swallowed (oral). The treatment will depend on the severity of the condition. Mild thrush Mild cases of thrush may clear up with the use of an antifungal mouth rinse or lozenges. Treatment usually lasts about 14 days. Moderate to severe thrush  More severe thrush infections that have spread to the esophagus are treated with an oral antifungal medicine. A topical antifungal medicine may also be used.  For some severe infections, treatment may need to continue for more than 14 days.  Oral antifungal medicines are rarely used during pregnancy because they may be harmful to the unborn child. If you are pregnant, talk with your health care provider about options for treatment. Persistent or recurrent thrush For cases of thrush that do not go away or keep coming back:  Treatment may be needed twice as long as the symptoms last.  Treatment will include  both oral and topical antifungal medicines.  People with a weakened immune system can take an antifungal medicine on a continuous basis to prevent thrush infections. It is important to treat conditions that make a person more likely to get thrush, such as diabetes or HIV. Follow these instructions at home: Medicines  Take over-the-counter and prescription medicines only as told by your health care provider.  Talk with your health care provider about an over-the-counter medicine called gentian violet, which kills bacteria and fungi. Relieving soreness and discomfort To help reduce the discomfort of thrush:  Drink cold liquids such as water or iced tea.  Try flavored ice treats or frozen juices.  Eat foods that are easy to swallow, such as gelatin, ice cream, or custard.  Try drinking from a straw if the patches in your mouth are painful.  General instructions  Eat plain, unflavored yogurt as directed by your health care provider. Check the label to make sure the yogurt contains live cultures. This yogurt can help healthy bacteria to grow in the mouth and can stop the growth of the fungus that causes thrush.  If you wear dentures, remove the dentures before going to bed, brush them vigorously, and soak them in a cleaning solution as directed by your health care provider.  Rinse your mouth with a warm salt-water mixture several times a day. To make a salt-water mixture, completely dissolve 1/2-1 tsp of salt in 1 cup of warm water. Contact a health care provider if:  Your symptoms are getting worse or are not improving within 7 days of starting treatment.  You have symptoms of a spreading infection, such as white patches on the skin outside of the mouth. This information is not intended to replace advice given to you by your health care provider. Make sure you discuss any questions you have with your health care provider. Document Released: 02/29/2004 Document Revised: 02/28/2016  Document Reviewed: 02/28/2016 Elsevier Interactive Patient Education  [employer].

## 2018-10-25 ENCOUNTER — Encounter: Payer: Self-pay | Admitting: Family Medicine

## 2018-11-04 ENCOUNTER — Ambulatory Visit: Payer: BLUE CROSS/BLUE SHIELD | Admitting: Family Medicine

## 2018-11-18 ENCOUNTER — Encounter: Payer: Self-pay | Admitting: Family Medicine

## 2018-11-18 MED ORDER — DULOXETINE HCL 60 MG PO CPEP: 60.0000 mg | ORAL_CAPSULE | Freq: Every day | ORAL | 3 refills | Status: DC

## 2018-11-20 ENCOUNTER — Other Ambulatory Visit: Payer: Self-pay

## 2018-11-20 ENCOUNTER — Ambulatory Visit (INDEPENDENT_AMBULATORY_CARE_PROVIDER_SITE_OTHER): Payer: BC Managed Care – PPO | Admitting: Family Medicine

## 2018-11-20 ENCOUNTER — Encounter: Payer: Self-pay | Admitting: Family Medicine

## 2018-11-20 VITALS — BP 121/82 | HR 83 | Temp 98.0°F | Ht 72.0 in | Wt 220.0 lb

## 2018-11-20 DIAGNOSIS — F411 Generalized anxiety disorder: Secondary | ICD-10-CM | POA: Diagnosis not present

## 2018-11-20 DIAGNOSIS — S14109S Unspecified injury at unspecified level of cervical spinal cord, sequela: Secondary | ICD-10-CM

## 2018-11-20 DIAGNOSIS — M5412 Radiculopathy, cervical region: Secondary | ICD-10-CM | POA: Diagnosis not present

## 2018-11-20 DIAGNOSIS — M4722 Other spondylosis with radiculopathy, cervical region: Secondary | ICD-10-CM | POA: Diagnosis not present

## 2018-11-20 MED ORDER — TRAMADOL HCL 50 MG PO TABS: 50.0000 mg | ORAL_TABLET | Freq: Four times a day (QID) | ORAL | 0 refills | Status: DC | PRN

## 2018-11-20 MED ORDER — GABAPENTIN 300 MG PO CAPS: 600.0000 mg | ORAL_CAPSULE | Freq: Three times a day (TID) | ORAL | 5 refills | Status: DC

## 2018-11-20 NOTE — Patient Instructions (Addendum)
Please follow up as scheduled for your next visit with me: 04/25/2019, sooner if needed.   Continue the high dose gabapentin.  Use meloxicam or advil as needed.  You may try using tramadol to see if that helps control your pain as well as needed.  IF your symptoms persist or worsen over the next 1-6 weeks, then I would recommend getting an MRI.   If you have any questions or concerns, please don't hesitate to send me a message via MyChart or call the office at [phone]. Thank you for visiting with Victor Meza today! It's our pleasure caring for you.  Cervical Radiculopathy  Cervical radiculopathy happens when a nerve in the neck (cervical nerve) is pinched or bruised. This condition can develop because of an injury or as part of the normal aging process. Pressure on the cervical nerves can cause pain or numbness that runs from the neck all the way down into the arm and fingers. Usually, this condition gets better with rest. Treatment may be needed if the condition does not improve. What are the causes? This condition may be caused by:  Injury.  Slipped (herniated) disk.  Muscle tightness in the neck because of overuse.  Arthritis.  Breakdown or degeneration in the bones and joints of the spine (spondylosis) due to aging.  Bone spurs that may develop near the cervical nerves. What are the signs or symptoms? Symptoms of this condition include:  Pain that runs from the neck to the arm and hand. The pain can be severe or irritating. It may be worse when the neck is moved.  Numbness or weakness in the affected arm and hand. How is this diagnosed? This condition may be diagnosed based on symptoms, medical history, and a physical exam. You may also have tests, including:  X-rays.  CT scan.  MRI.  Electromyogram (EMG).  Nerve conduction tests. How is this treated? In many cases, treatment is not needed for this condition. With rest, the condition usually gets better over time. If  treatment is needed, options may include:  Wearing a soft neck collar for short periods of time.  Physical therapy to strengthen your neck muscles.  Medicines, such as NSAIDs, oral corticosteroids, or spinal injections.  Surgery. This may be needed if other treatments do not help. Various types of surgery may be done depending on the cause of your problems. Follow these instructions at home: Managing pain  Take over-the-counter and prescription medicines only as told by your health care provider.  If directed, apply ice to the affected area. ? Put ice in a plastic bag. ? Place a towel between your skin and the bag. ? Leave the ice on for 20 minutes, 2-3 times per day.  If ice does not help, you can try using heat. Take a warm shower or warm bath, or use a heat pack as told by your health care provider.  Try a gentle neck and shoulder massage to help relieve symptoms. Activity  Rest as needed. Follow instructions from your health care provider about any restrictions on activities.  Do stretching and strengthening exercises as told by your health care provider or physical therapist. General instructions  If you were given a soft collar, wear it as told by your health care provider.  Use a flat pillow when you sleep.  Keep all follow-up visits as told by your health care provider. This is important. Contact a health care provider if:  Your condition does not improve with treatment. Get help right away  if:  Your pain gets much worse and cannot be controlled with medicines.  You have weakness or numbness in your hand, arm, face, or leg.  You have a high fever.  You have a stiff, rigid neck.  You lose control of your bowels or your bladder (have incontinence).  You have trouble with walking, balance, or speaking. This information is not intended to replace advice given to you by your health care provider. Make sure you discuss any questions you have with your health care  provider. Document Released: 02/28/2001 Document Revised: 11/11/2015 Document Reviewed: 07/30/2014 Elsevier Interactive Patient Education  [employer]2019 [employer]
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
## 2019-04-25 ENCOUNTER — Ambulatory Visit: Payer: BLUE CROSS/BLUE SHIELD | Admitting: Family Medicine

## 2019-04-25 ENCOUNTER — Ambulatory Visit (INDEPENDENT_AMBULATORY_CARE_PROVIDER_SITE_OTHER): Payer: BC Managed Care – PPO | Admitting: Family Medicine

## 2019-04-25 ENCOUNTER — Encounter: Payer: Self-pay | Admitting: Family Medicine

## 2019-04-25 VITALS — BP 131/82 | HR 82 | Ht 72.0 in | Wt 220.0 lb

## 2019-04-25 DIAGNOSIS — E782 Mixed hyperlipidemia: Secondary | ICD-10-CM

## 2019-04-25 DIAGNOSIS — F17210 Nicotine dependence, cigarettes, uncomplicated: Secondary | ICD-10-CM

## 2019-04-25 DIAGNOSIS — F411 Generalized anxiety disorder: Secondary | ICD-10-CM | POA: Diagnosis not present

## 2019-04-25 DIAGNOSIS — F339 Major depressive disorder, recurrent, unspecified: Secondary | ICD-10-CM

## 2019-04-25 DIAGNOSIS — Z8249 Family history of ischemic heart disease and other diseases of the circulatory system: Secondary | ICD-10-CM

## 2019-04-25 DIAGNOSIS — I10 Essential (primary) hypertension: Secondary | ICD-10-CM | POA: Diagnosis not present

## 2019-04-25 NOTE — Patient Instructions (Signed)
Please return in 3-6 months for your annual complete physical; please come fasting. Sooner for smoking cessation information or review of anxiety medications if needed.  Monitor your home blood pressure readings. We want them consistently in the 120s/70s.  If you have any questions or concerns, please don't hesitate to send me a message via MyChart or call the office at [phone]. Thank you for visiting with [place] today! It's our pleasure caring for you.

## 2019-04-25 NOTE — Progress Notes (Signed)
Virtual Visit via Video Note  Subjective  CC:  Chief Complaint  Patient presents with  . Hypertension    Patient has been checking at home blood pressures have been on average around 130/80. Taking medications daily with no problems.      I connected with Victor Meza on 04/25/19 at  8:20 AM EST by a video enabled telemedicine application and verified that I am speaking with the correct person using two identifiers. Location patient: Home Location provider: [facility] [address] Office Persons participating in the virtual visit: Victor Meza, Victor Ora, MD Victor Meza, CMA  I discussed the limitations of evaluation and management by telemedicine and the availability of in person appointments. The patient expressed understanding and agreed to proceed. HPI: Victor Meza is a 43 y.o. male who was contacted today to address the problems listed above in the chief complaint.  Hypertension f/u: Control is good . Pt reports he is doing well. taking medications as instructed, no medication side effects noted, no TIAs, no chest pain on exertion, no dyspnea on exertion, no swelling of ankles. Home readings are typically 120s-132/70s-80. Feels well. Still smoking and precontemplative about quitting. He denies adverse effects from his BP medications. Compliance with medication is good.   HLD: at goal on statin and well tolerated. + fh of early heart disease  Smoking; on wellbutrin for mood. Not interested in quitting yet. No lung sxs  Anxiety/depression: very stable. On cymbalta and wellbutrin (added 18 months ago). Handling stressors of life well now. No concerns. At some point, would be interested in weaning off wellbutrin if able. No panic sxs   BP Readings from Last 3 Encounters:  04/25/19 131/82  11/20/18 121/82  10/24/18 112/70   Wt Readings from Last 3 Encounters:  04/25/19 220 lb (99.8 kg)  11/20/18 220 lb (99.8 kg)  10/24/18 218 lb (98.9 kg)    Lab  Results  Component Value Date   CHOL 186 10/24/2018   CHOL 255 (H) 07/19/2018   CHOL 205 (A) 10/12/2017   Lab Results  Component Value Date   HDL 41.90 10/24/2018   HDL 39.90 07/19/2018   HDL 34 (A) 10/12/2017   Lab Results  Component Value Date   LDLCALC 110 (H) 10/24/2018   LDLCALC 188 (H) 07/19/2018   LDLCALC 127 10/12/2017   Lab Results  Component Value Date   TRIG 169.0 (H) 10/24/2018   TRIG 134.0 07/19/2018   TRIG 194 (A) 10/12/2017   Lab Results  Component Value Date   CHOLHDL 4 10/24/2018   CHOLHDL 6 07/19/2018   No results found for: LDLDIRECT Lab Results  Component Value Date   CREATININE 1.05 10/24/2018   BUN 14 10/24/2018   NA 140 10/24/2018   K 4.4 10/24/2018   CL 106 10/24/2018   CO2 27 10/24/2018    The 10-year ASCVD risk score Denman George DC Jr., et al., 2013) is: 6.8%   Values used to calculate the score:     Age: 73 years     Sex: Male     Is Non-Hispanic African American: No     Diabetic: No     Tobacco smoker: Yes     Systolic Blood Pressure: 131 mmHg     Is BP treated: Yes     HDL Cholesterol: 41.9 mg/dL     Total Cholesterol: 186 mg/dL  Assessment  1. Essential hypertension   2. Family history of premature CAD   3. Nicotine  dependence, cigarettes, uncomplicated   4. Generalized anxiety disorder   5. Major depression, recurrent, chronic (HCC)   6. Mixed dyslipidemia      Plan   HTN:  Good control. No changes. Monitor at home. If trending upward, would increase lisinopril dose.  HLD at goal. Due for recheck by may 2021  Depression/anxiety: excellent control. Counseling done. Given current covid stressors/politica angst, we defer weaning now. To discuss next time  Recommend prioritizing smoking cessation.  I discussed the assessment and treatment plan with the patient. The patient was provided an opportunity to ask questions and all were answered. The patient agreed with the plan and demonstrated an understanding of the instructions.    The patient was advised to call back or seek an in-person evaluation if the symptoms worsen or if the condition fails to improve as anticipated. Follow up: Return in about 6 months (around 10/23/2019) for complete physical.  Visit date not found  No orders of the defined types were placed in this encounter.     I reviewed the patients updated PMH, FH, and SocHx.    Patient Active Problem List   Diagnosis Date Noted  . Essential hypertension 07/19/2018    Priority: High  . Nicotine dependence, cigarettes, uncomplicated 07/19/2018    Priority: High  . Family history of premature CAD 07/19/2018    Priority: High  . OSA (obstructive sleep apnea) 07/28/2016    Priority: High  . Generalized anxiety disorder 02/16/2016    Priority: High  . Major depression, recurrent, chronic (HCC) 08/23/2015    Priority: High  . Mixed dyslipidemia 08/23/2015    Priority: High  . Raynaud's disease without gangrene 07/19/2018    Priority: Medium  . ADD (attention deficit disorder) 07/19/2018    Priority: Medium  . Genital warts 04/19/2017    Priority: Low  . Hypogonadism in male 08/23/2015    Priority: Low  . Osteoarthritis of spine with radiculopathy, cervical region 11/20/2018   Current Meds  Medication Sig  . amLODipine (NORVASC) 10 MG tablet Take 1 tablet (10 mg total) by mouth daily.  Marland Kitchen. atorvastatin (LIPITOR) 10 MG tablet Take 1 tablet (10 mg total) by mouth at bedtime.  Marland Kitchen. buPROPion (WELLBUTRIN XL) 300 MG 24 hr tablet Take 1 tablet (300 mg total) by mouth daily.  . DULoxetine (CYMBALTA) 60 MG capsule Take 1 capsule (60 mg total) by mouth daily.  Marland Kitchen. lisinopril (PRINIVIL,ZESTRIL) 10 MG tablet Take 1 tablet (10 mg total) by mouth daily.  . meloxicam (MOBIC) 15 MG tablet TAKE 1 TABLET BY MOUTH EVERY DAY    Allergies: Patient is allergic to morphine; other; percocet [oxycodone-acetaminophen]; and shellfish allergy. Family History: Patient family history includes Alcohol abuse in his maternal  grandfather; Arthritis in his father, mother, paternal grandfather, and paternal grandmother; Asthma in his maternal grandfather; Cancer in his father and paternal grandmother; Depression in his maternal grandfather and mother; Diabetes in his maternal grandmother, mother, and paternal grandmother; Hearing loss in his maternal grandmother; Heart disease in his father, maternal grandfather, maternal grandmother, and paternal grandmother; Hyperlipidemia in his father, maternal grandmother, and mother; Hypertension in his father, maternal grandmother, mother, and paternal grandmother; Hypothyroidism in his mother; Learning disabilities in his daughter; Liver disease in his paternal grandfather; Stroke in his father; Thyroid disease in his father. Social History:  Patient  reports that he has been smoking cigarettes. He has a 30.00 pack-year smoking history. He has never used smokeless tobacco. He reports current alcohol use. He reports that he  does not use drugs.  Review of Systems: Constitutional: Negative for fever malaise or anorexia Cardiovascular: negative for chest pain Respiratory: negative for SOB or persistent cough Gastrointestinal: negative for abdominal pain  OBJECTIVE Vitals: BP 131/82   Pulse 82   Ht 6' (1.829 m)   Wt 220 lb (99.8 kg)   BMI 29.84 kg/m  General: no acute distress , A&Ox3  Leamon Arnt, MD

## 2019-09-04 ENCOUNTER — Other Ambulatory Visit: Payer: Self-pay | Admitting: Family Medicine

## 2019-09-12 ENCOUNTER — Encounter: Payer: Self-pay | Admitting: Family Medicine

## 2019-09-12 MED ORDER — AMLODIPINE BESYLATE 10 MG PO TABS: 5.0000 mg | ORAL_TABLET | Freq: Every day | ORAL | 3 refills | Status: AC

## 2019-12-28 ENCOUNTER — Encounter: Payer: Self-pay | Admitting: Family Medicine

## 2019-12-29 ENCOUNTER — Other Ambulatory Visit: Payer: Self-pay | Admitting: Family Medicine

## 2019-12-29 MED ORDER — DULOXETINE HCL 30 MG PO CPEP: 60.0000 mg | ORAL_CAPSULE | Freq: Every day | ORAL | 0 refills | Status: DC

## 2020-07-15 DIAGNOSIS — A630 Anogenital (venereal) warts: Secondary | ICD-10-CM | POA: Diagnosis not present

## 2020-08-26 ENCOUNTER — Emergency Department (HOSPITAL_BASED_OUTPATIENT_CLINIC_OR_DEPARTMENT_OTHER)
Admission: EM | Admit: 2020-08-26 | Discharge: 2020-08-26 | Disposition: A | Discharge status: Home / Self Care | Payer: Worker's Compensation | Attending: Emergency Medicine | Admitting: Emergency Medicine

## 2020-08-26 ENCOUNTER — Other Ambulatory Visit: Payer: Self-pay

## 2020-08-26 ENCOUNTER — Encounter (HOSPITAL_BASED_OUTPATIENT_CLINIC_OR_DEPARTMENT_OTHER): Payer: Self-pay

## 2020-08-26 DIAGNOSIS — I10 Essential (primary) hypertension: Secondary | ICD-10-CM | POA: Insufficient documentation

## 2020-08-26 DIAGNOSIS — Y990 Civilian activity done for income or pay: Secondary | ICD-10-CM | POA: Diagnosis not present

## 2020-08-26 DIAGNOSIS — S61212A Laceration without foreign body of right middle finger without damage to nail, initial encounter: Secondary | ICD-10-CM | POA: Diagnosis not present

## 2020-08-26 DIAGNOSIS — S6991XA Unspecified injury of right wrist, hand and finger(s), initial encounter: Secondary | ICD-10-CM | POA: Diagnosis present

## 2020-08-26 DIAGNOSIS — Z79899 Other long term (current) drug therapy: Secondary | ICD-10-CM | POA: Diagnosis not present

## 2020-08-26 DIAGNOSIS — F17210 Nicotine dependence, cigarettes, uncomplicated: Secondary | ICD-10-CM | POA: Insufficient documentation

## 2020-08-26 DIAGNOSIS — W268XXA Contact with other sharp object(s), not elsewhere classified, initial encounter: Secondary | ICD-10-CM | POA: Insufficient documentation

## 2020-08-26 DIAGNOSIS — Z23 Encounter for immunization: Secondary | ICD-10-CM | POA: Insufficient documentation

## 2020-08-26 MED ORDER — TETANUS-DIPHTH-ACELL PERTUSSIS 5-2.5-18.5 LF-MCG/0.5 IM SUSY
0.5000 mL | PREFILLED_SYRINGE | Freq: Once | INTRAMUSCULAR | Status: AC
  Administered 2020-08-26: 0.5 mL via INTRAMUSCULAR
  Filled 2020-08-26: qty 0.5

## 2020-08-26 MED ORDER — BACITRACIN ZINC 500 UNIT/GM EX OINT: TOPICAL_OINTMENT | Freq: Two times a day (BID) | CUTANEOUS | Status: DC

## 2020-08-26 MED ORDER — LIDOCAINE HCL (PF) 1 % IJ SOLN
5.0000 mL | Freq: Once | INTRAMUSCULAR | Status: AC
  Administered 2020-08-26: 5 mL
  Filled 2020-08-26: qty 5

## 2020-08-26 NOTE — Discharge Instructions (Addendum)
Recommend wound check in 2 days, suture removal in 10 days. Splint for protection and to limit movement at the joint while wound heals. Keep wound clean and dry. Apply bacitracin twice daily.

## 2020-08-26 NOTE — ED Provider Notes (Signed)
[facility] EMERGENCY DEPARTMENT Provider Note   CSN: [id number] Arrival date & time: 08/26/20  1528     History Chief Complaint  Patient presents with  . Finger Injury    Victor Meza is a 44 y.o. male.  45 year old male presents with complaint of laceration to his right third finger.  Patient was at work earlier today when he cut the finger on a sharp bolt.  Bleeding is controlled with pressure bandage.  Patient is right-hand dominant, no other injuries.  Last tetanus unknown.        Past Medical History:  Diagnosis Date  . ADD (attention deficit disorder) 07/19/2018   Diagnosed as child: never treated as an adult  . Essential hypertension 07/19/2018  . Nicotine dependence, cigarettes, uncomplicated 07/19/2018    Patient Active Problem List   Diagnosis Date Noted  . Osteoarthritis of spine with radiculopathy, cervical region 11/20/2018  . Essential hypertension 07/19/2018  . Nicotine dependence, cigarettes, uncomplicated 07/19/2018  . Raynaud's disease without gangrene 07/19/2018  . Family history of premature CAD 07/19/2018  . ADD (attention deficit disorder) 07/19/2018  . Genital warts 04/19/2017  . OSA (obstructive sleep apnea) 07/28/2016  . Generalized anxiety disorder 02/16/2016  . Major depression, recurrent, chronic (HCC) 08/23/2015  . Mixed dyslipidemia 08/23/2015  . Hypogonadism in male 08/23/2015    Past Surgical History:  Procedure Laterality Date  . ANKLE SURGERY    . BACK SURGERY     L3, L4, L5, S1  . EYE SURGERY Left   . KNEE SURGERY Left   . NECK SURGERY Left    C2       Family History  Problem Relation Age of Onset  . Diabetes Mother   . Arthritis Mother   . Depression Mother   . Hyperlipidemia Mother   . Hypertension Mother   . Hypothyroidism Mother   . Stroke Father   . Thyroid disease Father   . Cancer Father   . Heart disease Father   . Arthritis Father   . Hyperlipidemia Father   . Hypertension Father   .  Diabetes Maternal Grandmother   . Hearing loss Maternal Grandmother   . Heart disease Maternal Grandmother   . Hyperlipidemia Maternal Grandmother   . Hypertension Maternal Grandmother   . Heart disease Maternal Grandfather   . Alcohol abuse Maternal Grandfather   . Asthma Maternal Grandfather   . Depression Maternal Grandfather   . Learning disabilities Daughter   . Arthritis Paternal Grandmother   . Cancer Paternal Grandmother   . Diabetes Paternal Grandmother   . Heart disease Paternal Grandmother   . Hypertension Paternal Grandmother   . Arthritis Paternal Grandfather   . Liver disease Paternal Grandfather     Social History   Tobacco Use  . Smoking status: Current Every Day Smoker    Packs/day: 1.00    Years: 30.00    Pack years: 30.00    Types: Cigarettes  . Smokeless tobacco: Never Used  Substance Use Topics  . Alcohol use: Not Currently  . Drug use: Never    Home Medications Prior to Admission medications   Medication Sig Start Date End Date Taking? Authorizing Provider  amLODipine (NORVASC) 10 MG tablet Take 0.5 tablets (5 mg total) by mouth daily. 09/12/19   Willow Ora, MD  atorvastatin (LIPITOR) 10 MG tablet Take 1 tablet (10 mg total) by mouth at bedtime. 07/22/18   Willow Ora, MD    Allergies    Morphine, Other, Percocet [oxycodone-acetaminophen],  and Shellfish allergy  Review of Systems   Review of Systems  Constitutional: Negative for fever.  Musculoskeletal: Negative for arthralgias and myalgias.  Skin: Positive for wound.  Allergic/Immunologic: Negative for immunocompromised state.  Neurological: Negative for weakness and numbness.  Hematological: Does not bruise/bleed easily.    Physical Exam Updated Vital Signs BP 118/86 (BP Location: Left Arm)   Pulse 65   Temp 98.2 F (36.8 C) (Oral)   Resp 18   Ht 6' (1.829 m)   Wt 83.9 kg   SpO2 100%   BMI 25.09 kg/m   Physical Exam Vitals and nursing note reviewed.  Constitutional:       General: He is not in acute distress.    Appearance: He is well-developed. He is not diaphoretic.  HENT:     Head: Normocephalic and atraumatic.  Cardiovascular:     Pulses: Normal pulses.  Pulmonary:     Effort: Pulmonary effort is normal.  Musculoskeletal:        General: Tenderness present. No swelling or deformity. Normal range of motion.     Comments: Flap shaped laceration to the palmar aspect of the right third finger distal phalanx.  Sensation intact distally, brisk capillary refill present, good strength against resistance.  Skin:    General: Skin is warm and dry.     Capillary Refill: Capillary refill takes less than 2 seconds.     Findings: No erythema or rash.  Neurological:     Mental Status: He is alert and oriented to person, place, and time.     Sensory: No sensory deficit.     Motor: No weakness.  Psychiatric:        Behavior: Behavior normal.     ED Results / Procedures / Treatments   Labs (all labs ordered are listed, but only abnormal results are displayed) Labs Reviewed - No data to display  EKG None  Radiology No results found.  Procedures .Marland KitchenLaceration Repair  Date/Time: 08/26/2020 6:28 PM Performed by: Jeannie Fend, PA-C Authorized by: Jeannie Fend, PA-C   Consent:    Consent obtained:  Verbal   Consent given by:  Patient   Risks discussed:  Infection, need for additional repair, pain, poor cosmetic result and poor wound healing   Alternatives discussed:  No treatment and delayed treatment Universal protocol:    Procedure explained and questions answered to patient or proxy's satisfaction: yes     Relevant documents present and verified: yes     Test results available: yes     Imaging studies available: yes     Required blood products, implants, devices, and special equipment available: yes     Site/side marked: yes     Immediately prior to procedure, a time out was called: yes     Patient identity confirmed:  Verbally with  patient Anesthesia:    Anesthesia method:  Nerve block   Block needle gauge:  25 G   Block anesthetic:  Lidocaine 1% w/o epi   Block technique:  Digital block   Block injection procedure:  Anatomic landmarks identified, anatomic landmarks palpated, introduced needle and incremental injection   Block outcome:  Anesthesia achieved Laceration details:    Location:  Finger   Finger location:  R long finger   Length (cm):  4   Depth (mm):  3 Pre-procedure details:    Preparation:  Patient was prepped and draped in usual sterile fashion Exploration:    Wound exploration: wound explored through full range of motion  and entire depth of wound visualized     Wound extent: no foreign bodies/material noted, no muscle damage noted, no nerve damage noted and no tendon damage noted   Treatment:    Area cleansed with:  Saline and povidone-iodine   Amount of cleaning:  Standard   Irrigation solution:  Sterile saline   Debridement:  None Skin repair:    Repair method:  Sutures   Suture size:  4-0   Suture material:  Nylon   Suture technique: 4 simple interrupted, 1 mattress.   Number of sutures:  5 Approximation:    Approximation:  Close Repair type:    Repair type:  Simple Post-procedure details:    Dressing:  Antibiotic ointment and splint for protection   Procedure completion:  Tolerated     Medications Ordered in ED Medications  bacitracin ointment (has no administration in time range)  lidocaine (PF) (XYLOCAINE) 1 % injection 5 mL (5 mLs Infiltration Given by Other 08/26/20 1702)  Tdap (BOOSTRIX) injection 0.5 mL (0.5 mLs Intramuscular Given 08/26/20 1703)    ED Course  I have reviewed the triage vital signs and the nursing notes.  Pertinent labs & imaging results that were available during my care of the patient were reviewed by me and considered in my medical decision making (see chart for details).  Clinical Course as of 08/26/20 1831  Thu Aug 26, 2020  2522 45 year old male  with laceration to the right third finger at the distal phalanx, bleeding controlled, sensation intact, normal range of motion with good strength against resistance.  Area was anesthetized with digital block with 1% plain lidocaine.  Wound was soaked in diluted iodine solution and irrigated with saline.  Wound was closed with 4 simple interrupted and 1 mattress suture without difficulty.  Patient tolerated procedure well.  Plan is to place in a splint for protection, recommend wound check in 2 days and suture removal in 10 days.  Tetanus updated today. [LM]    Clinical Course User Index [LM] Alden Hipp   MDM Rules/Calculators/A&P                          Final Clinical Impression(s) / ED Diagnoses Final diagnoses:  Laceration of right middle finger without foreign body without damage to nail, initial encounter    Rx / DC Orders ED Discharge Orders    None       Jeannie Fend, PA-C 08/26/20 1831    Charlynne Pander, MD 08/26/20 2356

## 2020-08-26 NOTE — ED Notes (Signed)
ED Provider at bedside. 

## 2020-08-26 NOTE — ED Triage Notes (Addendum)
Pt states he cut right middle finger on metal bolt at work ~130pm-pt with V shaped lac noted-slight oozing noted-dsg applied by EMT in traige-NAD-steady gait

## 2020-08-30 ENCOUNTER — Emergency Department (HOSPITAL_BASED_OUTPATIENT_CLINIC_OR_DEPARTMENT_OTHER)
Admission: EM | Admit: 2020-08-30 | Discharge: 2020-08-30 | Disposition: A | Discharge status: Home / Self Care | Payer: Worker's Compensation | Attending: Emergency Medicine | Admitting: Emergency Medicine

## 2020-08-30 ENCOUNTER — Encounter (HOSPITAL_BASED_OUTPATIENT_CLINIC_OR_DEPARTMENT_OTHER): Payer: Self-pay | Admitting: *Deleted

## 2020-08-30 ENCOUNTER — Other Ambulatory Visit: Payer: Self-pay

## 2020-08-30 DIAGNOSIS — Z4800 Encounter for change or removal of nonsurgical wound dressing: Secondary | ICD-10-CM | POA: Diagnosis not present

## 2020-08-30 DIAGNOSIS — I10 Essential (primary) hypertension: Secondary | ICD-10-CM | POA: Diagnosis not present

## 2020-08-30 DIAGNOSIS — Z79899 Other long term (current) drug therapy: Secondary | ICD-10-CM | POA: Insufficient documentation

## 2020-08-30 DIAGNOSIS — F17210 Nicotine dependence, cigarettes, uncomplicated: Secondary | ICD-10-CM | POA: Diagnosis not present

## 2020-08-30 DIAGNOSIS — X58XXXD Exposure to other specified factors, subsequent encounter: Secondary | ICD-10-CM | POA: Insufficient documentation

## 2020-08-30 DIAGNOSIS — S61212D Laceration without foreign body of right middle finger without damage to nail, subsequent encounter: Secondary | ICD-10-CM | POA: Diagnosis present

## 2020-08-30 DIAGNOSIS — Z5189 Encounter for other specified aftercare: Secondary | ICD-10-CM

## 2020-08-30 NOTE — ED Triage Notes (Signed)
Recheck middle finger lac    Sutures in tack  No redness or swelling noted  States he changes dressing 3 x /day  Having some drainage

## 2020-08-30 NOTE — Discharge Instructions (Signed)
Continue wound care.  Follow-up with your primary care doctor.  Your stitches will need to be removed/reevaluated approximately 3/20 if not before.

## 2020-08-30 NOTE — ED Provider Notes (Signed)
[facility] EMERGENCY DEPARTMENT Provider Note   CSN: [id number] Arrival date & time: 08/30/20  8185     History Chief Complaint  Patient presents with  . Wound Check    Victor Meza is a 45 y.o. male.  HPI Patient is here for wound check of his right middle finger he lacerated this and had it repaired 3/10.  He states no significant drainage although sometimes some serosanguineous--- described as clear fluid with occasionally a small on the blood--will ooze from wound.  He states he has no difficulty moving his finger.  His pain seems to be improving gradually.  Using ibuprofen with good relief.  No other associate symptoms.  No fevers or chills or systemic symptoms.  No significant redness.  No other associated symptoms.  Up with his primary care doctor.  Requesting work note for light duty.     Past Medical History:  Diagnosis Date  . ADD (attention deficit disorder) 07/19/2018   Diagnosed as child: never treated as an adult  . Essential hypertension 07/19/2018  . Nicotine dependence, cigarettes, uncomplicated 07/19/2018    Patient Active Problem List   Diagnosis Date Noted  . Osteoarthritis of spine with radiculopathy, cervical region 11/20/2018  . Essential hypertension 07/19/2018  . Nicotine dependence, cigarettes, uncomplicated 07/19/2018  . Raynaud's disease without gangrene 07/19/2018  . Family history of premature CAD 07/19/2018  . ADD (attention deficit disorder) 07/19/2018  . Genital warts 04/19/2017  . OSA (obstructive sleep apnea) 07/28/2016  . Generalized anxiety disorder 02/16/2016  . Major depression, recurrent, chronic (HCC) 08/23/2015  . Mixed dyslipidemia 08/23/2015  . Hypogonadism in male 08/23/2015    Past Surgical History:  Procedure Laterality Date  . ANKLE SURGERY    . BACK SURGERY     L3, L4, L5, S1  . EYE SURGERY Left   . KNEE SURGERY Left   . NECK SURGERY Left    C2       Family History  Problem Relation Age of Onset  .  Diabetes Mother   . Arthritis Mother   . Depression Mother   . Hyperlipidemia Mother   . Hypertension Mother   . Hypothyroidism Mother   . Stroke Father   . Thyroid disease Father   . Cancer Father   . Heart disease Father   . Arthritis Father   . Hyperlipidemia Father   . Hypertension Father   . Diabetes Maternal Grandmother   . Hearing loss Maternal Grandmother   . Heart disease Maternal Grandmother   . Hyperlipidemia Maternal Grandmother   . Hypertension Maternal Grandmother   . Heart disease Maternal Grandfather   . Alcohol abuse Maternal Grandfather   . Asthma Maternal Grandfather   . Depression Maternal Grandfather   . Learning disabilities Daughter   . Arthritis Paternal Grandmother   . Cancer Paternal Grandmother   . Diabetes Paternal Grandmother   . Heart disease Paternal Grandmother   . Hypertension Paternal Grandmother   . Arthritis Paternal Grandfather   . Liver disease Paternal Grandfather     Social History   Tobacco Use  . Smoking status: Current Every Day Smoker    Packs/day: 1.00    Years: 30.00    Pack years: 30.00    Types: Cigarettes  . Smokeless tobacco: Never Used  Substance Use Topics  . Alcohol use: Not Currently  . Drug use: Never    Home Medications Prior to Admission medications   Medication Sig Start Date End Date Taking? Authorizing Provider  amLODipine (  NORVASC) 10 MG tablet Take 0.5 tablets (5 mg total) by mouth daily. 09/12/19   Willow Ora, MD  atorvastatin (LIPITOR) 10 MG tablet Take 1 tablet (10 mg total) by mouth at bedtime. 07/22/18   Willow Ora, MD    Allergies    Morphine, Other, Percocet [oxycodone-acetaminophen], and Shellfish allergy  Review of Systems   Review of Systems  Constitutional: Negative for chills and fever.  HENT: Negative for congestion.   Respiratory: Negative for shortness of breath.   Cardiovascular: Negative for chest pain.  Gastrointestinal: Negative for abdominal pain.  Musculoskeletal:  Negative for neck pain.  Skin: Positive for wound.    Physical Exam Updated Vital Signs Ht 6' (1.829 m)   Wt 83.9 kg   BMI 25.09 kg/m   Physical Exam Vitals and nursing note reviewed.  Constitutional:      General: He is not in acute distress.    Appearance: Normal appearance. He is not ill-appearing.  HENT:     Head: Normocephalic and atraumatic.  Eyes:     General: No scleral icterus.       Right eye: No discharge.        Left eye: No discharge.     Conjunctiva/sclera: Conjunctivae normal.  Pulmonary:     Effort: Pulmonary effort is normal.     Breath sounds: No stridor.  Skin:    Comments: Right middle finger with V-shaped laceration repaired with good wound healing present no discharge or significant redness or tenderness with palpation.  Full range of motion of finger and good cap refill.  Neurological:     Mental Status: He is alert and oriented to person, place, and time. Mental status is at baseline.     ED Results / Procedures / Treatments   Labs (all labs ordered are listed, but only abnormal results are displayed) Labs Reviewed - No data to display  EKG None  Radiology No results found.  Procedures Procedures   Medications Ordered in ED Medications - No data to display  ED Course  I have reviewed the triage vital signs and the nursing notes.  Pertinent labs & imaging results that were available during my care of the patient were reviewed by me and considered in my medical decision making (see chart for details).    MDM Rules/Calculators/A&P                          Wound check is reassuring.  Patient will follow up with PCP for next wound check and medication for antibiotics at this time.  Patient is agreeable to plan.  Will follow up with PCP and continue wound care precautions.  Given work note.  Final Clinical Impression(s) / ED Diagnoses Final diagnoses:  Visit for wound check    Rx / DC Orders ED Discharge Orders    None        Gailen Shelter, [place] 08/30/20 1034    Alvira Monday, MD 08/31/20 [phone]

## 2020-09-06 ENCOUNTER — Encounter (HOSPITAL_BASED_OUTPATIENT_CLINIC_OR_DEPARTMENT_OTHER): Payer: Self-pay

## 2020-09-06 ENCOUNTER — Emergency Department (HOSPITAL_BASED_OUTPATIENT_CLINIC_OR_DEPARTMENT_OTHER)
Admission: EM | Admit: 2020-09-06 | Discharge: 2020-09-06 | Disposition: A | Discharge status: Home / Self Care | Payer: Worker's Compensation | Attending: Emergency Medicine | Admitting: Emergency Medicine

## 2020-09-06 ENCOUNTER — Other Ambulatory Visit: Payer: Self-pay

## 2020-09-06 DIAGNOSIS — F17210 Nicotine dependence, cigarettes, uncomplicated: Secondary | ICD-10-CM | POA: Diagnosis not present

## 2020-09-06 DIAGNOSIS — S61212D Laceration without foreign body of right middle finger without damage to nail, subsequent encounter: Secondary | ICD-10-CM | POA: Insufficient documentation

## 2020-09-06 DIAGNOSIS — Z79899 Other long term (current) drug therapy: Secondary | ICD-10-CM | POA: Insufficient documentation

## 2020-09-06 DIAGNOSIS — X58XXXD Exposure to other specified factors, subsequent encounter: Secondary | ICD-10-CM | POA: Diagnosis not present

## 2020-09-06 DIAGNOSIS — I10 Essential (primary) hypertension: Secondary | ICD-10-CM | POA: Insufficient documentation

## 2020-09-06 DIAGNOSIS — Z5189 Encounter for other specified aftercare: Secondary | ICD-10-CM

## 2020-09-06 DIAGNOSIS — Z4802 Encounter for removal of sutures: Secondary | ICD-10-CM | POA: Diagnosis not present

## 2020-09-06 NOTE — Discharge Instructions (Addendum)
Your wound looks like it's healed very well, and your exam was reassuring today.  Continue light duties for the next 3 days, then you can return to regular duties.   We talked about how to flex your finger at home.  If you are unable to flex the finger in 7 days, make an appointment with an orthopedic hand doctor at the number above.  If you notice new redness, swelling, or severe pain along your finger or palm, return to the ER.

## 2020-09-06 NOTE — ED Triage Notes (Signed)
Pt here for scheduled suture removal right middle finger.  Wound margins well proximated, no signs of infection.

## 2020-09-06 NOTE — ED Notes (Signed)
ED Provider at bedside. 

## 2020-09-06 NOTE — ED Provider Notes (Signed)
[facility] EMERGENCY DEPARTMENT Provider Note   CSN: [id number] Arrival date & time: 09/06/20  4098     History Chief Complaint  Patient presents with  . Suture / Staple Removal    Victor Meza is a 45 y.o. male presenting to ED for suture removal.  Right 3rd finger lacerated at work 11 days ago on 08/26/20, has 5 sutures placed in ED.  Here for removal.  No new pain, fever, redness, drainage.  Has been keeping it clean at home.  Light duties at work.  HPI     Past Medical History:  Diagnosis Date  . ADD (attention deficit disorder) 07/19/2018   Diagnosed as child: never treated as an adult  . Essential hypertension 07/19/2018  . Nicotine dependence, cigarettes, uncomplicated 07/19/2018    Patient Active Problem List   Diagnosis Date Noted  . Osteoarthritis of spine with radiculopathy, cervical region 11/20/2018  . Essential hypertension 07/19/2018  . Nicotine dependence, cigarettes, uncomplicated 07/19/2018  . Raynaud's disease without gangrene 07/19/2018  . Family history of premature CAD 07/19/2018  . ADD (attention deficit disorder) 07/19/2018  . Genital warts 04/19/2017  . OSA (obstructive sleep apnea) 07/28/2016  . Generalized anxiety disorder 02/16/2016  . Major depression, recurrent, chronic (HCC) 08/23/2015  . Mixed dyslipidemia 08/23/2015  . Hypogonadism in male 08/23/2015    Past Surgical History:  Procedure Laterality Date  . ANKLE SURGERY    . BACK SURGERY     L3, L4, L5, S1  . EYE SURGERY Left   . KNEE SURGERY Left   . NECK SURGERY Left    C2       Family History  Problem Relation Age of Onset  . Diabetes Mother   . Arthritis Mother   . Depression Mother   . Hyperlipidemia Mother   . Hypertension Mother   . Hypothyroidism Mother   . Stroke Father   . Thyroid disease Father   . Cancer Father   . Heart disease Father   . Arthritis Father   . Hyperlipidemia Father   . Hypertension Father   . Diabetes Maternal Grandmother   .  Hearing loss Maternal Grandmother   . Heart disease Maternal Grandmother   . Hyperlipidemia Maternal Grandmother   . Hypertension Maternal Grandmother   . Heart disease Maternal Grandfather   . Alcohol abuse Maternal Grandfather   . Asthma Maternal Grandfather   . Depression Maternal Grandfather   . Learning disabilities Daughter   . Arthritis Paternal Grandmother   . Cancer Paternal Grandmother   . Diabetes Paternal Grandmother   . Heart disease Paternal Grandmother   . Hypertension Paternal Grandmother   . Arthritis Paternal Grandfather   . Liver disease Paternal Grandfather     Social History   Tobacco Use  . Smoking status: Current Every Day Smoker    Packs/day: 1.00    Years: 30.00    Pack years: 30.00    Types: Cigarettes  . Smokeless tobacco: Never Used  Substance Use Topics  . Alcohol use: Not Currently  . Drug use: Never    Home Medications Prior to Admission medications   Medication Sig Start Date End Date Taking? Authorizing Provider  amLODipine (NORVASC) 10 MG tablet Take 0.5 tablets (5 mg total) by mouth daily. 09/12/19   Willow Ora, MD  atorvastatin (LIPITOR) 10 MG tablet Take 1 tablet (10 mg total) by mouth at bedtime. 07/22/18   Willow Ora, MD    Allergies    Morphine, Other, Percocet [  oxycodone-acetaminophen], and Shellfish allergy  Review of Systems   Review of Systems  Constitutional: Negative for chills and fever.  Respiratory: Negative for cough and shortness of breath.   Cardiovascular: Negative for chest pain and palpitations.  Gastrointestinal: Negative for abdominal pain and vomiting.  Musculoskeletal: Negative for arthralgias and myalgias.  Skin: Negative for rash and wound.  Neurological: Negative for weakness and light-headedness.  Psychiatric/Behavioral: Negative for agitation and confusion.  All other systems reviewed and are negative.   Physical Exam Updated Vital Signs BP 122/81 (BP Location: Right Arm)   Pulse 64    Temp 98.2 F (36.8 C) (Oral)   Resp 16   SpO2 100%   Physical Exam Constitutional:      General: He is not in acute distress. HENT:     Head: Normocephalic and atraumatic.  Eyes:     Conjunctiva/sclera: Conjunctivae normal.     Pupils: Pupils are equal, round, and reactive to light.  Cardiovascular:     Rate and Rhythm: Normal rate and regular rhythm.     Pulses: Normal pulses.  Pulmonary:     Effort: Pulmonary effort is normal. No respiratory distress.  Skin:    General: Skin is warm and dry.     Comments: Affected finger not held in flexion. No swelling or tenderness over tendon. No fusiform swelling of finger No pain with passive extension.  Neurological:     General: No focal deficit present.     Mental Status: He is alert. Mental status is at baseline.  Psychiatric:        Mood and Affect: Mood normal.        Behavior: Behavior normal.     ED Results / Procedures / Treatments   Labs (all labs ordered are listed, but only abnormal results are displayed) Labs Reviewed - No data to display  EKG None  Radiology No results found.  Procedures .Suture Removal  Date/Time: 09/06/2020 8:27 AM Performed by: Terald Sleeper, MD Authorized by: Terald Sleeper, MD   Consent:    Consent obtained:  Verbal   Consent given by:  Patient   Risks, benefits, and alternatives were discussed: not applicable     Risks discussed:  Bleeding and pain Universal protocol:    Patient identity confirmed:  Arm band Location:    Location:  Upper extremity   Upper extremity location:  Hand   Hand location:  R long finger Procedure details:    Wound appearance:  No signs of infection, good wound healing and clean   Number of sutures removed:  5 Post-procedure details:    Post-removal:  Band-Aid applied   Procedure completion:  Tolerated well, no immediate complications     Medications Ordered in ED Medications - No data to display  ED Course  I have reviewed the triage  vital signs and the nursing notes.  Pertinent labs & imaging results that were available during my care of the patient were reviewed by me and considered in my medical decision making (see chart for details).  5 sutures removed Finger looks good, no sign of infection Patient able to flex digit in the Ed No sign of tenosynovitis at this time  Okay for discharge    Final Clinical Impression(s) / ED Diagnoses Final diagnoses:  Visit for wound check  Visit for suture removal    Rx / DC Orders ED Discharge Orders    None       Trifan, Kermit Balo, MD 09/06/20 [phone]

## 2020-09-21 ENCOUNTER — Emergency Department (HOSPITAL_COMMUNITY): Payer: Worker's Compensation

## 2020-09-21 ENCOUNTER — Encounter (HOSPITAL_COMMUNITY): Payer: Self-pay

## 2020-09-21 ENCOUNTER — Emergency Department (HOSPITAL_COMMUNITY)
Admission: EM | Admit: 2020-09-21 | Discharge: 2020-09-21 | Disposition: A | Discharge status: Home / Self Care | Payer: Worker's Compensation | Attending: Emergency Medicine | Admitting: Emergency Medicine

## 2020-09-21 ENCOUNTER — Other Ambulatory Visit: Payer: Self-pay

## 2020-09-21 DIAGNOSIS — X58XXXA Exposure to other specified factors, initial encounter: Secondary | ICD-10-CM | POA: Insufficient documentation

## 2020-09-21 DIAGNOSIS — S31109A Unspecified open wound of abdominal wall, unspecified quadrant without penetration into peritoneal cavity, initial encounter: Secondary | ICD-10-CM

## 2020-09-21 DIAGNOSIS — S31149A Puncture wound of abdominal wall with foreign body, unspecified quadrant without penetration into peritoneal cavity, initial encounter: Secondary | ICD-10-CM

## 2020-09-21 DIAGNOSIS — S30851A Superficial foreign body of abdominal wall, initial encounter: Secondary | ICD-10-CM | POA: Insufficient documentation

## 2020-09-21 DIAGNOSIS — W458XXA Other foreign body or object entering through skin, initial encounter: Secondary | ICD-10-CM | POA: Insufficient documentation

## 2020-09-21 DIAGNOSIS — I10 Essential (primary) hypertension: Secondary | ICD-10-CM | POA: Insufficient documentation

## 2020-09-21 DIAGNOSIS — Z79899 Other long term (current) drug therapy: Secondary | ICD-10-CM | POA: Insufficient documentation

## 2020-09-21 DIAGNOSIS — F17210 Nicotine dependence, cigarettes, uncomplicated: Secondary | ICD-10-CM | POA: Diagnosis not present

## 2020-09-21 DIAGNOSIS — S3991XA Unspecified injury of abdomen, initial encounter: Secondary | ICD-10-CM | POA: Diagnosis present

## 2020-09-21 LAB — BASIC METABOLIC PANEL
Anion gap: 4 — ABNORMAL LOW (ref 5–15)
BUN: 14 mg/dL (ref 6–20)
CO2: 28 mmol/L (ref 22–32)
Calcium: 8.9 mg/dL (ref 8.9–10.3)
Chloride: 108 mmol/L (ref 98–111)
Creatinine, Ser: 1.01 mg/dL (ref 0.61–1.24)
GFR, Estimated: >60 mL/min (ref >60)
Glucose, Bld: 96 mg/dL (ref 70–99)
Potassium: 3.7 mmol/L (ref 3.5–5.1)
Sodium: 140 mmol/L (ref 135–145)

## 2020-09-21 LAB — CBC WITH DIFFERENTIAL/PLATELET
Abs Immature Granulocytes: 0.02 10*3/uL (ref 0.00–0.07)
Basophils Absolute: 0.1 10*3/uL (ref 0.0–0.1)
Basophils Relative: 1 %
Eosinophils Absolute: 0.4 10*3/uL (ref 0.0–0.5)
Eosinophils Relative: 4 %
HCT: 41.9 % (ref 39.0–52.0)
Hemoglobin: 14 g/dL (ref 13.0–17.0)
Immature Granulocytes: 0 %
Lymphocytes Relative: 28 %
Lymphs Abs: 2.4 10*3/uL (ref 0.7–4.0)
MCH: 30 pg (ref 26.0–34.0)
MCHC: 33.4 g/dL (ref 30.0–36.0)
MCV: 89.9 fL (ref 80.0–100.0)
Monocytes Absolute: 0.5 10*3/uL (ref 0.1–1.0)
Monocytes Relative: 6 %
Neutro Abs: 5.2 10*3/uL (ref 1.7–7.7)
Neutrophils Relative %: 61 %
Platelets: 247 10*3/uL (ref 150–400)
RBC: 4.66 MIL/uL (ref 4.22–5.81)
RDW: 12.2 % (ref 11.5–15.5)
WBC: 8.6 10*3/uL (ref 4.0–10.5)
nRBC: 0 % (ref 0.0–0.2)

## 2020-09-21 MED ORDER — FENTANYL CITRATE (PF) 100 MCG/2ML IJ SOLN
50.0000 ug | Freq: Once | INTRAMUSCULAR | Status: AC
  Administered 2020-09-21: 50 ug via INTRAVENOUS
  Filled 2020-09-21: qty 2

## 2020-09-21 MED ORDER — OXYCODONE HCL 5 MG PO TABS: 5.0000 mg | ORAL_TABLET | ORAL | 0 refills | Status: AC | PRN

## 2020-09-21 MED ORDER — SULFAMETHOXAZOLE-TRIMETHOPRIM 800-160 MG PO TABS: 1.0000 | ORAL_TABLET | Freq: Two times a day (BID) | ORAL | 0 refills | Status: AC

## 2020-09-21 MED ORDER — IOHEXOL 300 MG/ML  SOLN
100.0000 mL | Freq: Once | INTRAMUSCULAR | Status: AC | PRN
  Administered 2020-09-21: 100 mL via INTRAVENOUS

## 2020-09-21 MED ORDER — CIPROFLOXACIN HCL 500 MG PO TABS: 500.0000 mg | ORAL_TABLET | Freq: Two times a day (BID) | ORAL | 0 refills | Status: AC

## 2020-09-21 MED ORDER — SODIUM CHLORIDE 0.9 % IV BOLUS
1000.0000 mL | Freq: Once | INTRAVENOUS | Status: AC
  Administered 2020-09-21: 1000 mL via INTRAVENOUS

## 2020-09-21 NOTE — ED Triage Notes (Signed)
Pt from home c/o pain at injury site on ABD. Pt states he hit a metal bushing and thinks some metal is still inside skin. Pain 5/10 now. Last tetanus shot 3 weeks ago.

## 2020-09-21 NOTE — ED Provider Notes (Signed)
  Physical Exam  BP 126/80   Pulse 60   Temp 98.7 F (37.1 C) (Oral)   Resp 15   SpO2 97%   Physical Exam  ED Course/Procedures     Procedures  MDM   45 year old male presents with concern for projectile foreign body.  Please see previous note for history, physical and care.  CT abdomen pending at time of transfer of care, and Dr. Wilkie Aye has spoken with both general surgery and trauma surgery regarding patient and agree given timing and hemodynamic stability will not activate/level trauma for penetrating abdominal trauma.  Evaluated CT after completion which shows intraperitoneal foreign body. Discussed with Dr. Janee Morn at 16:52 who will come evaluate Victor Meza. Given fentanyl for pain.   Dr. Derrell Lolling of surgery came to bedside to evaluate the patient.  He is recommending discharge with antibiotics and return precautions.  He is not recommending any intervention or admission.  Victor Meza remains hemodynamically stable in the emergency department.  Discussed pain control.  Dr. Derrell Lolling recommended Bactrim for antibiotics, discussed with pharmacy who recommends Cipro.  Given prescription and recommend follow-up with outpatient surgery clinic.     Alvira Monday, MD 09/22/20 [phone]

## 2020-09-21 NOTE — ED Notes (Signed)
Patient transported to CT 

## 2020-09-21 NOTE — ED Notes (Signed)
Got patient into a gown on the monitor patient is resting with call bell in reach 

## 2020-09-21 NOTE — Consult Note (Signed)
Reason for Consult: Abdominal pain Referring Physician: Dr. Gwenette GreetHorton  Victor Meza is an 45 y.o. male.  HPI: Patient is a 45 year old male, who comes in secondary to a penetrating trauma injury.  Patient states he was at work, and hammering a bush and off of a bucket for a [employer] trade[employer].  Patient states that a portion of the pushing sheared off penetrated his abdomen.  Patient had significant abdominal pain.  Patient denied any nausea or vomiting.  Patient underwent work-up in the ER.  Per CT scan patient was found to have a metallic foreign body just deep to his rectus muscle however was external to his bladder.  This was discussed with radiology.  There is no signs of free air or bowel injury.  I did review the CT scans personally.  Trauma surgery was consulted for further evaluation.  Patient does have a lower midline incision secondary to back surgery.  Past Medical History:  Diagnosis Date  . ADD (attention deficit disorder) 07/19/2018   Diagnosed as child: never treated as an adult  . Essential hypertension 07/19/2018  . Nicotine dependence, cigarettes, uncomplicated 07/19/2018    Past Surgical History:  Procedure Laterality Date  . ANKLE SURGERY    . BACK SURGERY     L3, L4, L5, S1  . EYE SURGERY Left   . KNEE SURGERY Left   . NECK SURGERY Left    C2    Family History  Problem Relation Age of Onset  . Diabetes Mother   . Arthritis Mother   . Depression Mother   . Hyperlipidemia Mother   . Hypertension Mother   . Hypothyroidism Mother   . Stroke Father   . Thyroid disease Father   . Cancer Father   . Heart disease Father   . Arthritis Father   . Hyperlipidemia Father   . Hypertension Father   . Diabetes Maternal Grandmother   . Hearing loss Maternal Grandmother   . Heart disease Maternal Grandmother   . Hyperlipidemia Maternal Grandmother   . Hypertension Maternal Grandmother   . Heart disease Maternal Grandfather   . Alcohol abuse Maternal Grandfather   . Asthma  Maternal Grandfather   . Depression Maternal Grandfather   . Learning disabilities Daughter   . Arthritis Paternal Grandmother   . Cancer Paternal Grandmother   . Diabetes Paternal Grandmother   . Heart disease Paternal Grandmother   . Hypertension Paternal Grandmother   . Arthritis Paternal Grandfather   . Liver disease Paternal Grandfather     Social History:  reports that he has been smoking cigarettes. He has a 30.00 pack-year smoking history. He has never used smokeless tobacco. He reports previous alcohol use. He reports that he does not use drugs.  Allergies:  Allergies  Allergen Reactions  . Morphine Itching  . Other     eggplant  . Percocet [Oxycodone-Acetaminophen]   . Shellfish Allergy     Medications: I have reviewed the patient's current medications.  Results for orders placed or performed during the hospital encounter of 09/21/20 (from the past 48 hour(s))  CBC with Differential     Status: None   Collection Time: 09/21/20  3:20 PM  Result Value Ref Range   WBC 8.6 4.0 - 10.5 K/uL   RBC 4.66 4.22 - 5.81 MIL/uL   Hemoglobin 14.0 13.0 - 17.0 g/dL   HCT 46.941.9 62.939.0 - 52.852.0 %   MCV 89.9 80.0 - 100.0 fL   MCH 30.0 26.0 - 34.0 pg   MCHC 33.4  30.0 - 36.0 g/dL   RDW 29.9 24.2 - 68.3 %   Platelets 247 150 - 400 K/uL   nRBC 0.0 0.0 - 0.2 %   Neutrophils Relative % 61 %   Neutro Abs 5.2 1.7 - 7.7 K/uL   Lymphocytes Relative 28 %   Lymphs Abs 2.4 0.7 - 4.0 K/uL   Monocytes Relative 6 %   Monocytes Absolute 0.5 0.1 - 1.0 K/uL   Eosinophils Relative 4 %   Eosinophils Absolute 0.4 0.0 - 0.5 K/uL   Basophils Relative 1 %   Basophils Absolute 0.1 0.0 - 0.1 K/uL   Immature Granulocytes 0 %   Abs Immature Granulocytes 0.02 0.00 - 0.07 K/uL    Comment: Performed at [facility], [address], [place], [place] [address]  Basic metabolic panel     Status: Abnormal   Collection Time: 09/21/20  3:20 PM  Result Value Ref Range   Sodium 140 135 - 145 mmol/L    Potassium 3.7 3.5 - 5.1 mmol/L   Chloride 108 98 - 111 mmol/L   CO2 28 22 - 32 mmol/L   Glucose, Bld 96 70 - 99 mg/dL    Comment: Glucose reference range applies only to samples taken after fasting for at least 8 hours.   BUN 14 6 - 20 mg/dL   Creatinine, Ser 2.29 0.61 - 1.24 mg/dL   Calcium 8.9 8.9 - 79.8 mg/dL   GFR, Estimated >92 >11 mL/min    Comment: (NOTE) Calculated using the CKD-EPI Creatinine Equation (2021)    Anion gap 4 (L) 5 - 15    Comment: Performed at [facility], [address], [place], [place] [address]    DG Abdomen 1 View  Result Date: 09/21/2020 CLINICAL DATA:  Work injury.  Foreign body in skin. EXAM: ABDOMEN - 1 VIEW COMPARISON:  One-view AP abdomen 09/21/2020 FINDINGS: 6 mm metallic foreign body projects anterior to the bony pelvis on the lateral view. It is just above the level of the acetabulum. Solid fusion is present lumbar spine at L4-5 and L5-S1. IMPRESSION: 6 mm metallic foreign body projects anterior to the bony pelvis on the lateral view. Electronically Signed   By: Marin Roberts M.D.   On: 09/21/2020 13:55   DG Abdomen 1 View  Result Date: 09/21/2020 CLINICAL DATA:  Evaluate for foreign body. EXAM: ABDOMEN - 1 VIEW COMPARISON:  None. FINDINGS: There is a small, 6 mm, indeterminate metallic density within the right side of pelvis in the projection of the right sacral wing. Postop change from posterior lumbar spine interbody hardware fusion. Bowel gas pattern appears normal. IMPRESSION: 1. Indeterminate radiopaque foreign body identified within the projection of the right sacrum measuring 6 mm. Electronically Signed   By: Signa Kell M.D.   On: 09/21/2020 13:22   CT Abdomen Pelvis W Contrast  Result Date: 09/21/2020 CLINICAL DATA:  Abdominal trauma. Patient was hammering a pipe and a piece of metal struck his lower abdomen. EXAM: CT ABDOMEN AND PELVIS WITH CONTRAST TECHNIQUE: Multidetector CT imaging of the abdomen and pelvis was performed  using the standard protocol following bolus administration of intravenous contrast. CONTRAST:  OMNIPAQUE IOHEXOL 300 MG/ML  SOLN COMPARISON:  None. FINDINGS: Lower chest: The lung bases are clear of acute process. No pleural effusion or pulmonary lesions. The heart is normal in size. No pericardial effusion. The distal esophagus and aorta are unremarkable. Hepatobiliary: No hepatic lesions or acute hepatic injury. No perihepatic fluid collections. Gallbladder  is unremarkable. No common bile duct dilatation. Pancreas: No mass, inflammation or ductal dilatation. Spleen: Normal size. No focal lesions. No acute splenic injury or perisplenic fluid collection. Adrenals/Urinary Tract: Adrenal glands and kidneys are unremarkable. No renal lesions or hydronephrosis. No acute renal injury or perinephric fluid collection. Stomach/Bowel: The stomach, duodenum, small bowel and colon are unremarkable. No acute inflammatory changes, mass lesions or obstructive findings. No secondary signs of acute bowel injury. No free air or free fluid. The terminal ileum and appendix are normal. Vascular/Lymphatic: The aorta is normal in caliber. No dissection. The branch vessels are patent. The major venous structures are patent. No mesenteric or retroperitoneal mass or adenopathy. Small scattered lymph nodes are noted. Reproductive: The prostate gland and seminal vesicles are unremarkable. Other: There is a metallic foreign body noted between the right rectus muscle and the right-side of the bladder. It measures approximately 4 mm. It appears that this entered the extraperitoneal pelvis through the right rectus muscle which contains a small hematoma. There is also a small amount of hemorrhage around the fragment there are some nearby small bowel loops but I do not see any findings suspicious for bowel injury. Musculoskeletal: No significant bony findings. Anterior, posterior and interbody fusion hardware noted. IMPRESSION: 1. 4 mm  metallic foreign body noted between the right rectus muscle and the right-side of the bladder. Associated small hematoma in the right rectus muscle and also a small amount of hemorrhage in the extraperitoneal pelvic space anterior to the bladder. No findings suspicious for bowel injury. 2. No other significant abdominal/pelvic findings. Electronically Signed   By: Rudie Meyer M.D.   On: 09/21/2020 16:59    Review of Systems  HENT: Negative for ear discharge, ear pain, hearing loss and tinnitus.   Eyes: Negative for photophobia and pain.  Respiratory: Negative for cough and shortness of breath.   Cardiovascular: Negative for chest pain.  Gastrointestinal: Positive for abdominal pain. Negative for nausea and vomiting.  Genitourinary: Negative for dysuria, flank pain, frequency and urgency.  Musculoskeletal: Negative for back pain, myalgias and neck pain.  Neurological: Negative for dizziness and headaches.  Hematological: Does not bruise/bleed easily.  Psychiatric/Behavioral: The patient is not nervous/anxious.    Blood pressure (!) 141/92, pulse 69, temperature 98.7 F (37.1 C), temperature source Oral, resp. rate 20, SpO2 99 %. Physical Exam Vitals reviewed.  Constitutional:      General: He is not in acute distress.    Appearance: Normal appearance. He is well-developed. He is not diaphoretic.     Interventions: Cervical collar and nasal cannula in place.  HENT:     Head: Normocephalic and atraumatic. No raccoon eyes, Battle's sign, abrasion, contusion or laceration.     Right Ear: Hearing, tympanic membrane, ear canal and external ear normal. No laceration, drainage or tenderness. No foreign body. No hemotympanum. Tympanic membrane is not perforated.     Left Ear: Hearing, tympanic membrane, ear canal and external ear normal. No laceration, drainage or tenderness. No foreign body. No hemotympanum. Tympanic membrane is not perforated.     Nose: Nose normal. No nasal deformity or  laceration.     Mouth/Throat:     Mouth: No lacerations.     Pharynx: Uvula midline.  Eyes:     General: Lids are normal. No scleral icterus.    Conjunctiva/sclera: Conjunctivae normal.     Pupils: Pupils are equal, round, and reactive to light.  Neck:     Thyroid: No thyromegaly.     Vascular: No  carotid bruit or JVD.     Trachea: Trachea normal.  Cardiovascular:     Rate and Rhythm: Normal rate and regular rhythm.     Pulses: Normal pulses.     Heart sounds: Normal heart sounds.  Pulmonary:     Effort: Pulmonary effort is normal. No respiratory distress.     Breath sounds: Normal breath sounds.  Chest:     Chest wall: No lacerations, tenderness or crepitus.  Abdominal:     General: Bowel sounds are decreased. There is no distension.     Palpations: Abdomen is soft. Abdomen is not rigid.     Tenderness: There is no abdominal tenderness. There is no guarding or rebound.    Musculoskeletal:        General: No tenderness. Normal range of motion.     Cervical back: No spinous process tenderness or muscular tenderness.  Lymphadenopathy:     Cervical: No cervical adenopathy.  Skin:    General: Skin is warm and dry.  Neurological:     Mental Status: He is alert and oriented to person, place, and time.     GCS: GCS eye subscore is 4. GCS verbal subscore is 5. GCS motor subscore is 6.     Cranial Nerves: No cranial nerve deficit.     Sensory: No sensory deficit.  Psychiatric:        Speech: Speech normal.        Behavior: Behavior normal. Behavior is cooperative.     Assessment/Plan: 45 year old male with penetrating abdominal wall trauma. At this point it appears that he has no injury to the bladder or bowel per CT scan.  Patient does have possibility of infection secondary to nature of the bushing. I long discussion with the patient and his wife in regards to signs and symptoms of general peritonitis, infection, and need to return to the ER.  Would send patient home on  Bactrim. Patient otherwise can follow-up as needed.  Axel Filler 09/21/2020, 5:25 PM

## 2020-09-21 NOTE — Discharge Instructions (Addendum)
If you develop worsening abdominal pain, fevers, nausea, vomiting, bloody stool or other concerns please return to the ED.

## 2020-09-21 NOTE — ED Notes (Signed)
Patient transported to X-ray 

## 2020-09-21 NOTE — ED Provider Notes (Signed)
[facility] EMERGENCY DEPARTMENT Provider Note   CSN: [id number] Arrival date & time: 09/21/20  1052     History Chief Complaint  Patient presents with  . Injury    Victor Meza is a 45 y.o. male.  HPI   45 year old male presents the emergency department concern for foreign body under his skin of the abdomen.  Patient states that he was hammering a metal blushing when he thinks a part of the metal flew off and into his skin.  He says it went through his shirt and abdominal skin.  He has tenderness around the area, concern that there is a piece stuck underneath the skin.  Last tetanus was updated 3 weeks ago.  No other injury sustained.  Past Medical History:  Diagnosis Date  . ADD (attention deficit disorder) 07/19/2018   Diagnosed as child: never treated as an adult  . Essential hypertension 07/19/2018  . Nicotine dependence, cigarettes, uncomplicated 07/19/2018    Patient Active Problem List   Diagnosis Date Noted  . Osteoarthritis of spine with radiculopathy, cervical region 11/20/2018  . Essential hypertension 07/19/2018  . Nicotine dependence, cigarettes, uncomplicated 07/19/2018  . Raynaud's disease without gangrene 07/19/2018  . Family history of premature CAD 07/19/2018  . ADD (attention deficit disorder) 07/19/2018  . Genital warts 04/19/2017  . OSA (obstructive sleep apnea) 07/28/2016  . Generalized anxiety disorder 02/16/2016  . Major depression, recurrent, chronic (HCC) 08/23/2015  . Mixed dyslipidemia 08/23/2015  . Hypogonadism in male 08/23/2015    Past Surgical History:  Procedure Laterality Date  . ANKLE SURGERY    . BACK SURGERY     L3, L4, L5, S1  . EYE SURGERY Left   . KNEE SURGERY Left   . NECK SURGERY Left    C2       Family History  Problem Relation Age of Onset  . Diabetes Mother   . Arthritis Mother   . Depression Mother   . Hyperlipidemia Mother   . Hypertension Mother   . Hypothyroidism Mother   . Stroke Father    . Thyroid disease Father   . Cancer Father   . Heart disease Father   . Arthritis Father   . Hyperlipidemia Father   . Hypertension Father   . Diabetes Maternal Grandmother   . Hearing loss Maternal Grandmother   . Heart disease Maternal Grandmother   . Hyperlipidemia Maternal Grandmother   . Hypertension Maternal Grandmother   . Heart disease Maternal Grandfather   . Alcohol abuse Maternal Grandfather   . Asthma Maternal Grandfather   . Depression Maternal Grandfather   . Learning disabilities Daughter   . Arthritis Paternal Grandmother   . Cancer Paternal Grandmother   . Diabetes Paternal Grandmother   . Heart disease Paternal Grandmother   . Hypertension Paternal Grandmother   . Arthritis Paternal Grandfather   . Liver disease Paternal Grandfather     Social History   Tobacco Use  . Smoking status: Current Every Day Smoker    Packs/day: 1.00    Years: 30.00    Pack years: 30.00    Types: Cigarettes  . Smokeless tobacco: Never Used  Substance Use Topics  . Alcohol use: Not Currently  . Drug use: Never    Home Medications Prior to Admission medications   Medication Sig Start Date End Date Taking? Authorizing Provider  amLODipine (NORVASC) 10 MG tablet Take 0.5 tablets (5 mg total) by mouth daily. 09/12/19   Willow Ora, MD  atorvastatin (LIPITOR)  10 MG tablet Take 1 tablet (10 mg total) by mouth at bedtime. 07/22/18   Willow Ora, MD    Allergies    Morphine, Other, Percocet [oxycodone-acetaminophen], and Shellfish allergy  Review of Systems   Review of Systems  Constitutional: Negative for chills and fever.  Respiratory: Negative for shortness of breath.   Cardiovascular: Negative for chest pain.  Gastrointestinal: Positive for abdominal pain. Negative for diarrhea and vomiting.  Genitourinary: Negative for dysuria.  Skin: Negative for rash.  Neurological: Negative for headaches.    Physical Exam Updated Vital Signs BP 133/87 (BP Location: Right  Arm)   Pulse 65   Temp 98.7 F (37.1 C) (Oral)   Resp 18   SpO2 98%   Physical Exam Vitals and nursing note reviewed.  Constitutional:      Appearance: Normal appearance.  HENT:     Head: Normocephalic.     Mouth/Throat:     Mouth: Mucous membranes are moist.  Cardiovascular:     Rate and Rhythm: Normal rate.  Pulmonary:     Effort: Pulmonary effort is normal. No respiratory distress.  Abdominal:     Palpations: Abdomen is soft.     Tenderness: There is no abdominal tenderness.     Comments: Very small linear cut to the abdominal skin, does not appear to violate the actual skin or deeper layers, patient is very tender to palpation of that in any area around it, no abdominal swelling or bruising  Skin:    General: Skin is warm.  Neurological:     Mental Status: He is alert and oriented to person, place, and time. Mental status is at baseline.  Psychiatric:        Mood and Affect: Mood normal.     ED Results / Procedures / Treatments   Labs (all labs ordered are listed, but only abnormal results are displayed) Labs Reviewed - No data to display  EKG None  Radiology No results found.  Procedures Procedures   Medications Ordered in ED Medications - No data to display  ED Course  I have reviewed the triage vital signs and the nursing notes.  Pertinent labs & imaging results that were available during my care of the patient were reviewed by me and considered in my medical decision making (see chart for details).    MDM Rules/Calculators/A&P                          Turn for a piece of metal that could have come off and embedded itself into his abdomen.  He has a very small punctate mark in his right lower abdomen, no active bleeding.  Vitals are stable, abdomen is benign.  We did x-rays which does show a hyperdense foreign body in the lower abdomen, unable to tell the depth.  Recommendation is for blood work and a CT of the abdomen pelvis to identify how deep the  foreign body is, Victor Meza from trauma surgery has been made aware since this is technically penetrating abdominal trauma and currently agrees with management.  Patient signed out to Dr. Dalene Meza pending CT.  Final Clinical Impression(s) / ED Diagnoses Final diagnoses:  None    Rx / DC Orders ED Discharge Orders    None       Rozelle Logan, DO 09/21/20 1533

## 2020-09-22 ENCOUNTER — Encounter: Payer: Self-pay | Admitting: Family Medicine

## 2020-09-22 ENCOUNTER — Other Ambulatory Visit: Payer: Self-pay

## 2020-09-22 DIAGNOSIS — S31129A Laceration of abdominal wall with foreign body, unspecified quadrant without penetration into peritoneal cavity, initial encounter: Secondary | ICD-10-CM

## 2020-09-22 NOTE — Telephone Encounter (Signed)
I have reviewed all records.  He can see a general surgeon if he'd like. Can refer to [place] [facility].

## 2020-12-07 DIAGNOSIS — F4310 Post-traumatic stress disorder, unspecified: Secondary | ICD-10-CM | POA: Diagnosis not present

## 2020-12-07 DIAGNOSIS — F4322 Adjustment disorder with anxiety: Secondary | ICD-10-CM | POA: Diagnosis not present

## 2020-12-14 DIAGNOSIS — F4322 Adjustment disorder with anxiety: Secondary | ICD-10-CM | POA: Diagnosis not present

## 2020-12-14 DIAGNOSIS — F4310 Post-traumatic stress disorder, unspecified: Secondary | ICD-10-CM | POA: Diagnosis not present

## 2020-12-21 DIAGNOSIS — F411 Generalized anxiety disorder: Secondary | ICD-10-CM | POA: Diagnosis not present

## 2020-12-22 DIAGNOSIS — A630 Anogenital (venereal) warts: Secondary | ICD-10-CM | POA: Diagnosis not present

## 2020-12-28 DIAGNOSIS — F411 Generalized anxiety disorder: Secondary | ICD-10-CM | POA: Diagnosis not present

## 2021-01-04 DIAGNOSIS — A630 Anogenital (venereal) warts: Secondary | ICD-10-CM | POA: Diagnosis not present

## 2021-01-04 DIAGNOSIS — F411 Generalized anxiety disorder: Secondary | ICD-10-CM | POA: Diagnosis not present

## 2021-01-11 DIAGNOSIS — F411 Generalized anxiety disorder: Secondary | ICD-10-CM | POA: Diagnosis not present

## 2021-01-26 DIAGNOSIS — F4310 Post-traumatic stress disorder, unspecified: Secondary | ICD-10-CM | POA: Diagnosis not present

## 2021-01-26 DIAGNOSIS — F411 Generalized anxiety disorder: Secondary | ICD-10-CM | POA: Diagnosis not present

## 2021-02-15 DIAGNOSIS — F411 Generalized anxiety disorder: Secondary | ICD-10-CM | POA: Diagnosis not present

## 2021-03-01 DIAGNOSIS — F411 Generalized anxiety disorder: Secondary | ICD-10-CM | POA: Diagnosis not present

## 2021-03-15 DIAGNOSIS — F411 Generalized anxiety disorder: Secondary | ICD-10-CM | POA: Diagnosis not present

## 2021-03-29 DIAGNOSIS — F411 Generalized anxiety disorder: Secondary | ICD-10-CM | POA: Diagnosis not present

## 2021-04-12 DIAGNOSIS — F4323 Adjustment disorder with mixed anxiety and depressed mood: Secondary | ICD-10-CM | POA: Diagnosis not present

## 2021-04-26 DIAGNOSIS — F411 Generalized anxiety disorder: Secondary | ICD-10-CM | POA: Diagnosis not present

## 2021-05-10 DIAGNOSIS — F4323 Adjustment disorder with mixed anxiety and depressed mood: Secondary | ICD-10-CM | POA: Diagnosis not present

## 2022-03-13 ENCOUNTER — Encounter: Payer: Self-pay | Admitting: *Deleted

## 2022-05-02 IMAGING — DX DG ABDOMEN 1V
1 series · 1 of 1 positions shown · non-contrast
Comparison: None.

CLINICAL DATA: Evaluate for foreign body.

EXAM:
ABDOMEN - 1 VIEW

[abdomen kub]
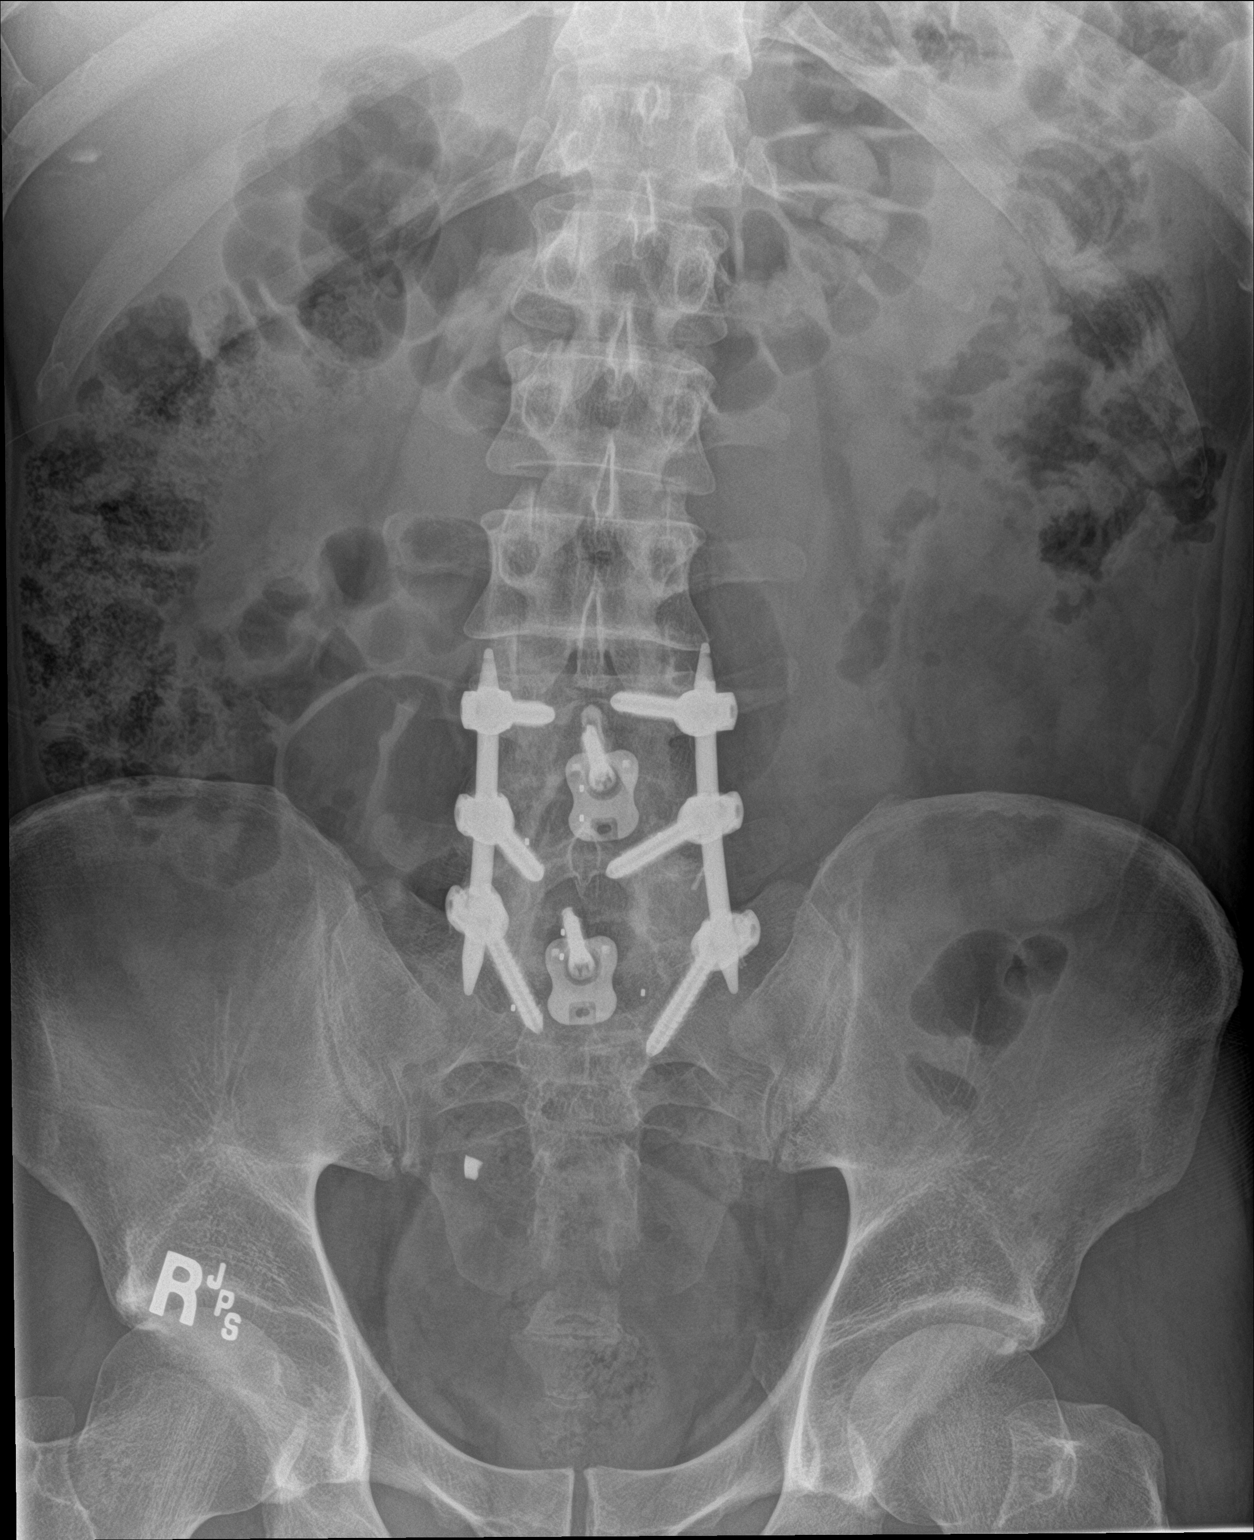

[1 of 1 positions shown; findings below may reference images not displayed]

FINDINGS: There is a small, 6 mm, indeterminate metallic density within the
right side of pelvis in the projection of the right sacral wing.

Postop change from posterior lumbar spine interbody hardware fusion.

Bowel gas pattern appears normal.
IMPRESSION: 1. Indeterminate radiopaque foreign body identified within the
projection of the right sacrum measuring 6 mm.

## 2022-05-02 IMAGING — CT CT ABD-PELV W/ CM
2 of 5 series · 16 of 46 positions shown, 18 images · IV contrast (Omni 300)
Comparison: None.

CLINICAL DATA: Abdominal trauma. Patient was hammering a pipe and a
piece of metal struck his lower abdomen.

EXAM:
CT ABDOMEN AND PELVIS WITH CONTRAST
TECHNIQUE: Multidetector CT imaging of the abdomen and pelvis was performed
using the standard protocol following bolus administration of
intravenous contrast.
CONTRAST:  100mL OMNIPAQUE IOHEXOL 300 MG/ML  SOLN

[Series 3: a/p w/ 5mm · axial · 0.75mm/px · z∈[-532,-107]mm · 13 of 96 slices shown, 15 images]
[im 6/96  soft-tissue]
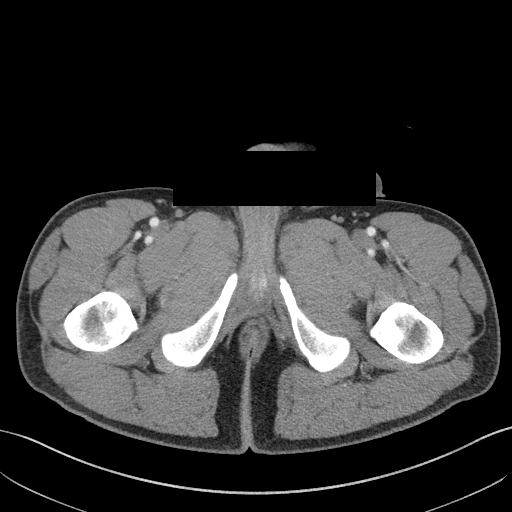
[im 6/96  bone]
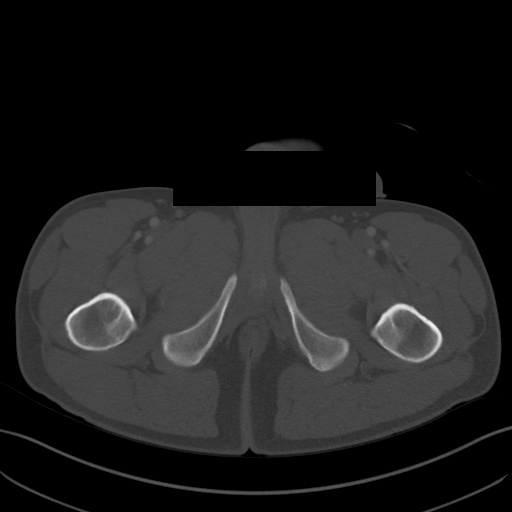
[im 16/96  soft-tissue]
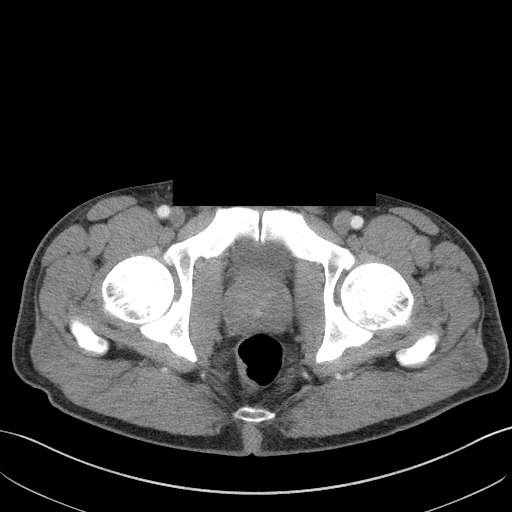
[im 21/96  soft-tissue]
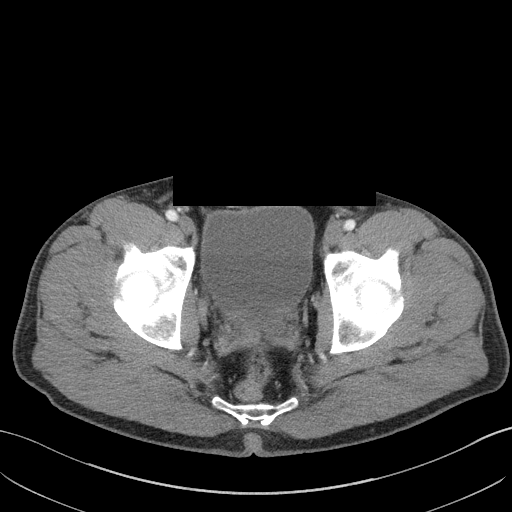
[im 26/96  soft-tissue]
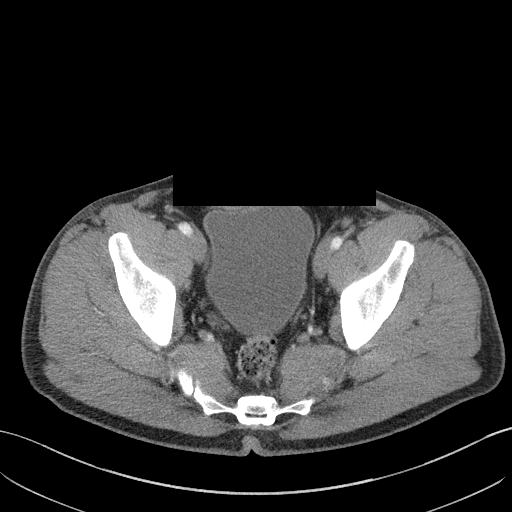
[im 36/96  soft-tissue]
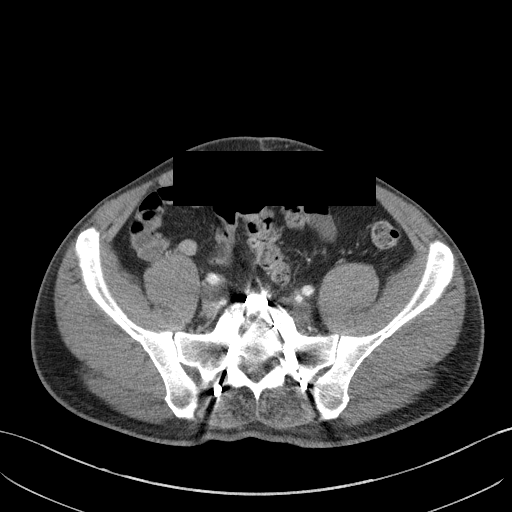
[im 41/96  soft-tissue]
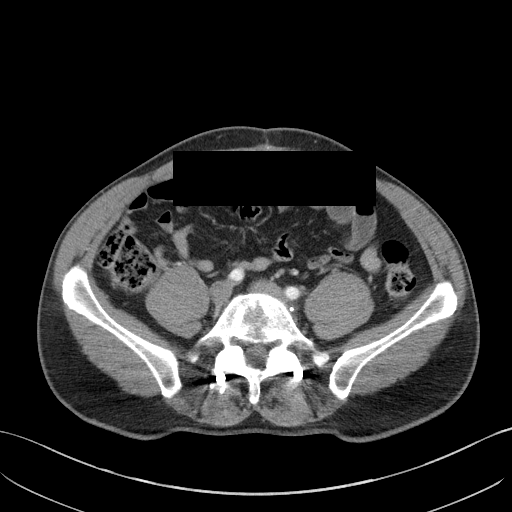
[im 51/96  soft-tissue]
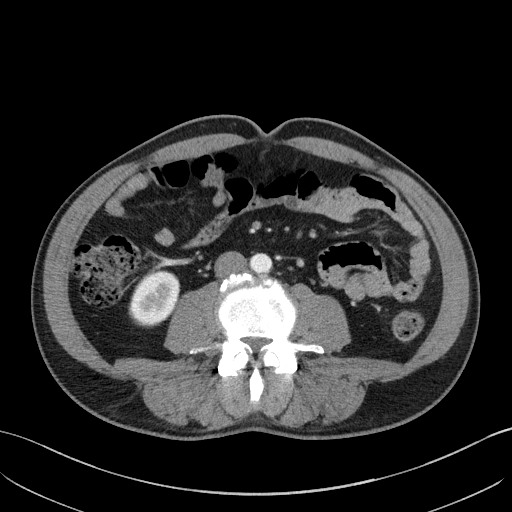
[im 56/96  soft-tissue]
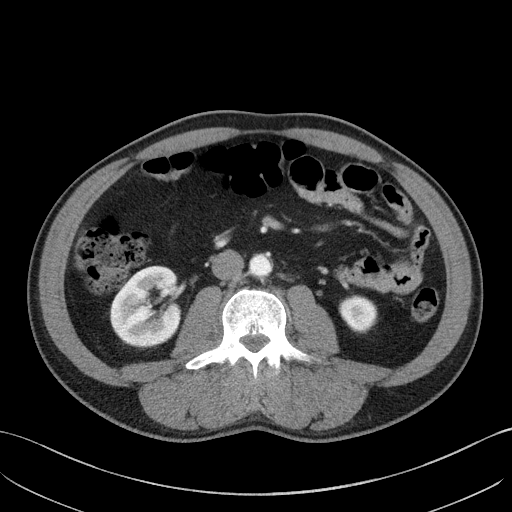
[im 61/96  soft-tissue]
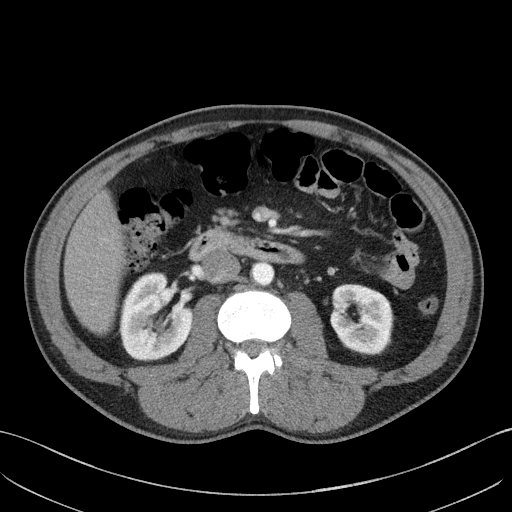
[im 61/96  bone]
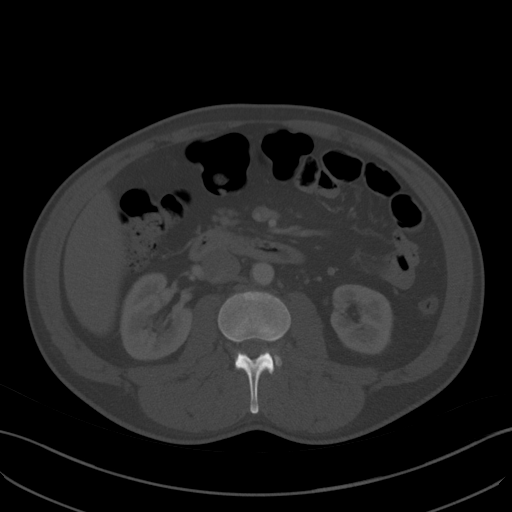
[im 71/96  soft-tissue]
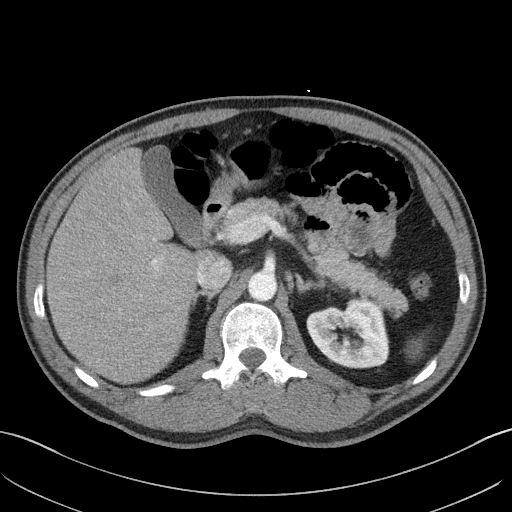
[im 76/96  soft-tissue]
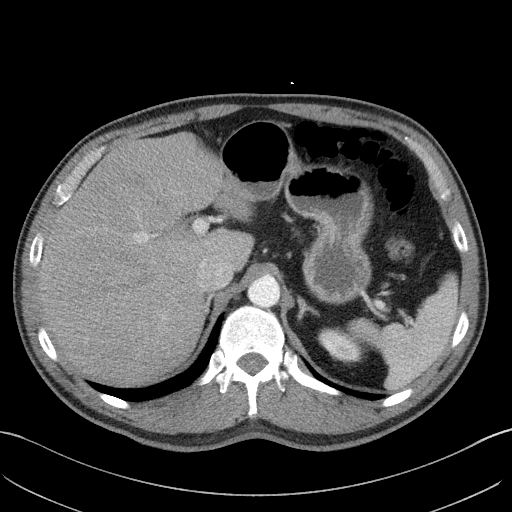
[im 81/96  soft-tissue]
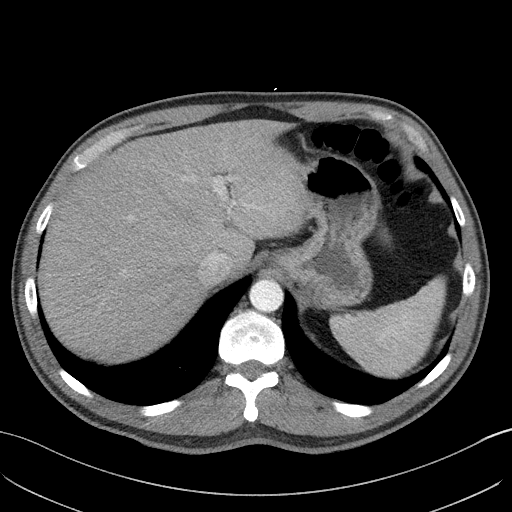
[im 91/96  soft-tissue]
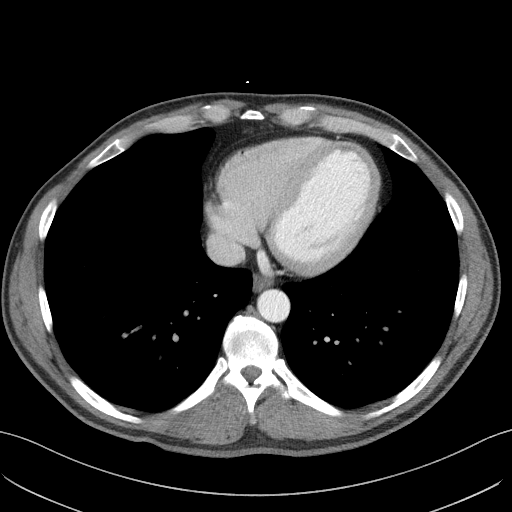

[Series 6: a/p w/ cor · coronal · 0.86mm/px · 3 of 151 slices shown]
[im 51/151  soft-tissue]
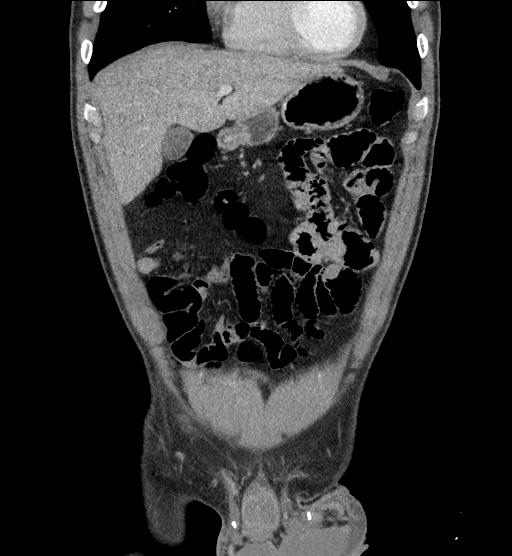
[im 67/151  soft-tissue]
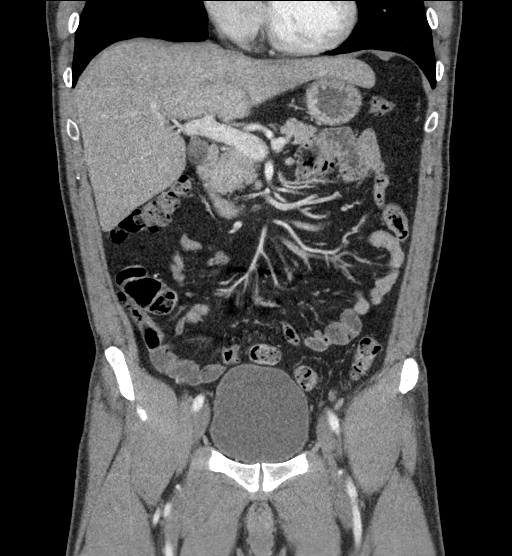
[im 84/151  soft-tissue]
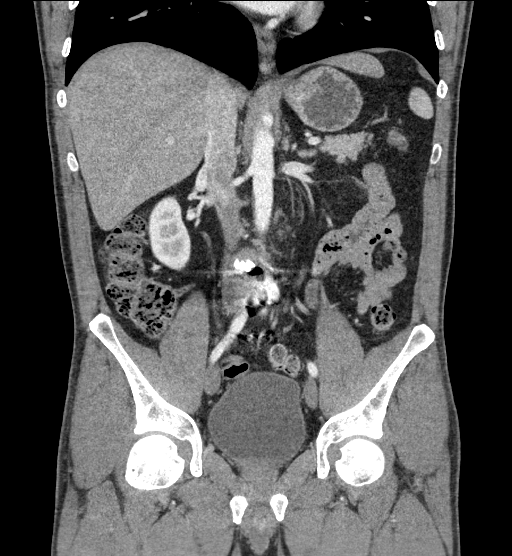

[16 of 46 positions shown; findings below may reference images not displayed]

FINDINGS: Lower chest: The lung bases are clear of acute process. No pleural
effusion or pulmonary lesions. The heart is normal in size. No
pericardial effusion. The distal esophagus and aorta are
unremarkable.

Hepatobiliary: No hepatic lesions or acute hepatic injury. No
perihepatic fluid collections. Gallbladder is unremarkable. No
common bile duct dilatation.

Pancreas: No mass, inflammation or ductal dilatation.

Spleen: Normal size. No focal lesions. No acute splenic injury or
perisplenic fluid collection.

Adrenals/Urinary Tract: Adrenal glands and kidneys are unremarkable.
No renal lesions or hydronephrosis. No acute renal injury or
perinephric fluid collection.

Stomach/Bowel: The stomach, duodenum, small bowel and colon are
unremarkable. No acute inflammatory changes, mass lesions or
obstructive findings. No secondary signs of acute bowel injury. No
free air or free fluid. The terminal ileum and appendix are normal.

Vascular/Lymphatic: The aorta is normal in caliber. No dissection.
The branch vessels are patent. The major venous structures are
patent. No mesenteric or retroperitoneal mass or adenopathy. Small
scattered lymph nodes are noted.

Reproductive: The prostate gland and seminal vesicles are
unremarkable.

Other: There is a metallic foreign body noted between the right
rectus muscle and the right-side of the bladder. It measures
approximately 4 mm. It appears that this entered the extraperitoneal
pelvis through the right rectus muscle which contains a small
hematoma. There is also a small amount of hemorrhage around the
fragment there are some nearby small bowel loops but I do not see
any findings suspicious for bowel injury.

Musculoskeletal: No significant bony findings. Anterior, posterior
and interbody fusion hardware noted.
IMPRESSION: 1. 4 mm metallic foreign body noted between the right rectus muscle
and the right-side of the bladder. Associated small hematoma in the
right rectus muscle and also a small amount of hemorrhage in the
extraperitoneal pelvic space anterior to the bladder. No findings
suspicious for bowel injury.
2. No other significant abdominal/pelvic findings.

## 2022-06-01 ENCOUNTER — Encounter: Payer: Self-pay | Admitting: *Deleted
